# Patient Record
Sex: Female | Born: 1955 | Race: White | Hispanic: No | Smoking: Never smoker
Health system: Southern US, Community
[De-identification: ages and names within clinical notes are randomized; demographics above are authoritative.]

## PROBLEM LIST (undated history)

## (undated) HISTORY — PX: ABDOMINAL HYSTERECTOMY: SHX81

---

## 2000-04-23 ENCOUNTER — Other Ambulatory Visit: Admission: RE | Admit: 2000-04-23 | Discharge: 2000-04-23 | Payer: Self-pay | Admitting: Gynecology

## 2000-04-24 ENCOUNTER — Encounter: Admission: RE | Admit: 2000-04-24 | Discharge: 2000-04-24 | Payer: Self-pay | Admitting: Gynecology

## 2000-04-24 ENCOUNTER — Encounter: Payer: Self-pay | Admitting: Gynecology

## 2001-05-10 ENCOUNTER — Encounter: Admission: RE | Admit: 2001-05-10 | Discharge: 2001-05-10 | Payer: Self-pay | Admitting: Gynecology

## 2001-05-10 ENCOUNTER — Encounter: Payer: Self-pay | Admitting: Gynecology

## 2003-08-31 ENCOUNTER — Ambulatory Visit (HOSPITAL_COMMUNITY): Admission: RE | Admit: 2003-08-31 | Discharge: 2003-08-31 | Payer: Self-pay | Admitting: Family Medicine

## 2003-09-11 ENCOUNTER — Encounter: Admission: RE | Admit: 2003-09-11 | Discharge: 2003-09-11 | Payer: Self-pay | Admitting: Family Medicine

## 2003-10-05 ENCOUNTER — Other Ambulatory Visit: Admission: RE | Admit: 2003-10-05 | Discharge: 2003-10-05 | Payer: Self-pay | Admitting: Family Medicine

## 2005-03-13 ENCOUNTER — Encounter: Admission: RE | Admit: 2005-03-13 | Discharge: 2005-03-13 | Payer: Self-pay | Admitting: Family Medicine

## 2005-06-26 ENCOUNTER — Other Ambulatory Visit: Admission: RE | Admit: 2005-06-26 | Discharge: 2005-06-26 | Payer: Self-pay | Admitting: Family Medicine

## 2006-03-17 ENCOUNTER — Encounter: Admission: RE | Admit: 2006-03-17 | Discharge: 2006-03-17 | Payer: Self-pay | Admitting: Family Medicine

## 2007-03-30 ENCOUNTER — Encounter: Admission: RE | Admit: 2007-03-30 | Discharge: 2007-03-30 | Payer: Self-pay | Admitting: Family Medicine

## 2007-04-01 ENCOUNTER — Other Ambulatory Visit: Admission: RE | Admit: 2007-04-01 | Discharge: 2007-04-01 | Payer: Self-pay | Admitting: Family Medicine

## 2008-04-24 ENCOUNTER — Encounter: Admission: RE | Admit: 2008-04-24 | Discharge: 2008-04-24 | Payer: Self-pay | Admitting: Family Medicine

## 2009-04-25 ENCOUNTER — Encounter: Admission: RE | Admit: 2009-04-25 | Discharge: 2009-04-25 | Payer: Self-pay | Admitting: Family Medicine

## 2010-04-22 ENCOUNTER — Other Ambulatory Visit (HOSPITAL_COMMUNITY): Payer: Self-pay | Admitting: Family Medicine

## 2010-04-22 DIAGNOSIS — Z1231 Encounter for screening mammogram for malignant neoplasm of breast: Secondary | ICD-10-CM

## 2010-04-30 ENCOUNTER — Other Ambulatory Visit (HOSPITAL_COMMUNITY): Payer: Self-pay | Admitting: Family Medicine

## 2010-04-30 ENCOUNTER — Ambulatory Visit
Admission: RE | Admit: 2010-04-30 | Discharge: 2010-04-30 | Disposition: A | Discharge status: Home / Self Care | Payer: BC Managed Care – PPO | Source: Ambulatory Visit | Attending: Family Medicine | Admitting: Family Medicine

## 2010-04-30 ENCOUNTER — Other Ambulatory Visit: Payer: Self-pay | Admitting: Family Medicine

## 2010-04-30 ENCOUNTER — Ambulatory Visit (HOSPITAL_COMMUNITY): Admission: RE | Admit: 2010-04-30 | Payer: BC Managed Care – PPO | Source: Ambulatory Visit

## 2010-04-30 DIAGNOSIS — Z1231 Encounter for screening mammogram for malignant neoplasm of breast: Secondary | ICD-10-CM

## 2011-04-21 ENCOUNTER — Other Ambulatory Visit: Payer: Self-pay | Admitting: Family Medicine

## 2011-04-21 DIAGNOSIS — Z1231 Encounter for screening mammogram for malignant neoplasm of breast: Secondary | ICD-10-CM

## 2011-05-01 ENCOUNTER — Ambulatory Visit
Admission: RE | Admit: 2011-05-01 | Discharge: 2011-05-01 | Disposition: A | Discharge status: Home / Self Care | Payer: BC Managed Care – PPO | Source: Ambulatory Visit | Attending: Family Medicine | Admitting: Family Medicine

## 2011-05-01 DIAGNOSIS — Z1231 Encounter for screening mammogram for malignant neoplasm of breast: Secondary | ICD-10-CM

## 2011-05-05 ENCOUNTER — Other Ambulatory Visit: Payer: Self-pay | Admitting: Family Medicine

## 2011-05-05 DIAGNOSIS — R928 Other abnormal and inconclusive findings on diagnostic imaging of breast: Secondary | ICD-10-CM

## 2011-05-12 ENCOUNTER — Ambulatory Visit
Admission: RE | Admit: 2011-05-12 | Discharge: 2011-05-12 | Disposition: A | Discharge status: Home / Self Care | Payer: BC Managed Care – PPO | Source: Ambulatory Visit | Attending: Family Medicine | Admitting: Family Medicine

## 2011-05-12 DIAGNOSIS — R928 Other abnormal and inconclusive findings on diagnostic imaging of breast: Secondary | ICD-10-CM

## 2012-04-19 ENCOUNTER — Other Ambulatory Visit: Payer: Self-pay

## 2012-04-19 DIAGNOSIS — Z1231 Encounter for screening mammogram for malignant neoplasm of breast: Secondary | ICD-10-CM

## 2012-05-14 ENCOUNTER — Ambulatory Visit
Admission: RE | Admit: 2012-05-14 | Discharge: 2012-05-14 | Disposition: A | Discharge status: Home / Self Care | Payer: BC Managed Care – PPO | Source: Ambulatory Visit

## 2012-05-14 DIAGNOSIS — Z1231 Encounter for screening mammogram for malignant neoplasm of breast: Secondary | ICD-10-CM

## 2013-06-21 ENCOUNTER — Other Ambulatory Visit: Payer: Self-pay

## 2013-06-21 DIAGNOSIS — Z1231 Encounter for screening mammogram for malignant neoplasm of breast: Secondary | ICD-10-CM

## 2013-07-07 ENCOUNTER — Ambulatory Visit
Admission: RE | Admit: 2013-07-07 | Discharge: 2013-07-07 | Disposition: A | Discharge status: Home / Self Care | Payer: BC Managed Care – PPO | Source: Ambulatory Visit

## 2013-07-07 ENCOUNTER — Encounter (INDEPENDENT_AMBULATORY_CARE_PROVIDER_SITE_OTHER): Payer: Self-pay

## 2013-07-07 DIAGNOSIS — Z1231 Encounter for screening mammogram for malignant neoplasm of breast: Secondary | ICD-10-CM

## 2014-07-20 ENCOUNTER — Other Ambulatory Visit: Payer: Self-pay

## 2014-07-20 DIAGNOSIS — Z1231 Encounter for screening mammogram for malignant neoplasm of breast: Secondary | ICD-10-CM

## 2014-07-25 ENCOUNTER — Ambulatory Visit
Admission: RE | Admit: 2014-07-25 | Discharge: 2014-07-25 | Disposition: A | Discharge status: Home / Self Care | Payer: BC Managed Care – PPO | Source: Ambulatory Visit

## 2014-07-25 DIAGNOSIS — Z1231 Encounter for screening mammogram for malignant neoplasm of breast: Secondary | ICD-10-CM

## 2015-01-01 ENCOUNTER — Ambulatory Visit: Payer: BC Managed Care – PPO | Admitting: Sports Medicine

## 2015-08-07 ENCOUNTER — Other Ambulatory Visit: Payer: Self-pay | Admitting: Family Medicine

## 2015-08-07 DIAGNOSIS — Z1231 Encounter for screening mammogram for malignant neoplasm of breast: Secondary | ICD-10-CM

## 2015-08-16 ENCOUNTER — Ambulatory Visit
Admission: RE | Admit: 2015-08-16 | Discharge: 2015-08-16 | Disposition: A | Discharge status: Home / Self Care | Payer: BC Managed Care – PPO | Source: Ambulatory Visit | Attending: Family Medicine | Admitting: Family Medicine

## 2015-08-16 ENCOUNTER — Ambulatory Visit: Payer: BC Managed Care – PPO

## 2015-08-16 DIAGNOSIS — Z1231 Encounter for screening mammogram for malignant neoplasm of breast: Secondary | ICD-10-CM

## 2016-01-04 ENCOUNTER — Ambulatory Visit
Admission: RE | Admit: 2016-01-04 | Discharge: 2016-01-04 | Disposition: A | Discharge status: Home / Self Care | Payer: BC Managed Care – PPO | Source: Ambulatory Visit | Attending: Orthopedic Surgery | Admitting: Orthopedic Surgery

## 2016-01-04 ENCOUNTER — Other Ambulatory Visit: Payer: Self-pay | Admitting: Orthopedic Surgery

## 2016-01-04 DIAGNOSIS — S82142A Displaced bicondylar fracture of left tibia, initial encounter for closed fracture: Secondary | ICD-10-CM

## 2016-01-15 ENCOUNTER — Encounter (HOSPITAL_COMMUNITY)
Admission: RE | Admit: 2016-01-15 | Discharge: 2016-01-15 | Disposition: A | Discharge status: Home / Self Care | Payer: BC Managed Care – PPO | Source: Ambulatory Visit | Attending: Orthopedic Surgery | Admitting: Orthopedic Surgery

## 2016-01-15 ENCOUNTER — Encounter (HOSPITAL_COMMUNITY): Payer: Self-pay

## 2016-01-15 DIAGNOSIS — S82142A Displaced bicondylar fracture of left tibia, initial encounter for closed fracture: Secondary | ICD-10-CM

## 2016-01-15 DIAGNOSIS — S83282A Other tear of lateral meniscus, current injury, left knee, initial encounter: Secondary | ICD-10-CM | POA: Diagnosis not present

## 2016-01-15 DIAGNOSIS — Z01812 Encounter for preprocedural laboratory examination: Secondary | ICD-10-CM

## 2016-01-15 DIAGNOSIS — S83422A Sprain of lateral collateral ligament of left knee, initial encounter: Secondary | ICD-10-CM | POA: Diagnosis not present

## 2016-01-15 DIAGNOSIS — X58XXXA Exposure to other specified factors, initial encounter: Secondary | ICD-10-CM

## 2016-01-15 LAB — CBC
HEMATOCRIT: 38.5 % (ref 36.0–46.0)
Hemoglobin: 12.9 g/dL (ref 12.0–15.0)
MCH: 30 pg (ref 26.0–34.0)
MCHC: 33.5 g/dL (ref 30.0–36.0)
MCV: 89.5 fL (ref 78.0–100.0)
Platelets: 439 10*3/uL — ABNORMAL HIGH (ref 150–400)
RBC: 4.3 MIL/uL (ref 3.87–5.11)
RDW: 13.8 % (ref 11.5–15.5)
WBC: 7.2 10*3/uL (ref 4.0–10.5)

## 2016-01-15 MED ORDER — CHLORHEXIDINE GLUCONATE 4 % EX LIQD
60.0000 mL | Freq: Once | CUTANEOUS | Status: DC
Start: 2016-01-15 — End: 2016-01-16

## 2016-01-15 NOTE — Pre-Procedure Instructions (Signed)
    Chapman FitchJill H Davidson  01/15/2016      [id number] - [place], [place] - [address] [place] [address] Phone: [phone] Fax: [phone]    Your procedure is scheduled on November 30.  [facility] Ad[facility] at 5:30 A.M.  Call this number if you have problems the morning of surgery:  [phone]   Remember:  Do not eat food or drink liquids after midnight.  Take these medicines the morning of surgery with A SIP OF WATER : pain pill if needed.   STOP herbal medications, advil, aleve, ibuprofen, fish oil.     Do not wear jewelry, make-up or nail polish.  Do not wear lotions, powders, or perfumes, or deoderant.  Do not shave 48 hours prior to surgery.  Men may shave face and neck.  Do not bring valuables to the hospital.  [facility] is not responsible for any belongings or valuables.  Contacts, dentures or bridgework may not be worn into surgery.  Leave your suitcase in the car.  After surgery it may be brought to your room.  For patients admitted to the hospital, discharge time will be determined by your treatment team.  Patients discharged the day of surgery will not be allowed to drive home.   Name and phone number of your driver:    Special instructions:  Preparing for surgery  Please read over the following fact sheets that you were given. Pain Booklet and Surgical Site Infection Prevention

## 2016-01-17 ENCOUNTER — Ambulatory Visit (HOSPITAL_COMMUNITY): Payer: BC Managed Care – PPO

## 2016-01-17 ENCOUNTER — Encounter (HOSPITAL_COMMUNITY)
Admission: RE | Disposition: A | Discharge status: Home / Self Care | Payer: Self-pay | Source: Ambulatory Visit | Attending: Orthopedic Surgery

## 2016-01-17 ENCOUNTER — Ambulatory Visit (HOSPITAL_COMMUNITY): Payer: BC Managed Care – PPO | Admitting: Certified Registered"

## 2016-01-17 ENCOUNTER — Observation Stay (HOSPITAL_COMMUNITY)
Admission: RE | Admit: 2016-01-17 | Discharge: 2016-01-19 | Disposition: A | Discharge status: Home / Self Care | Payer: BC Managed Care – PPO | Source: Ambulatory Visit | Attending: Orthopedic Surgery | Admitting: Orthopedic Surgery

## 2016-01-17 ENCOUNTER — Encounter (HOSPITAL_COMMUNITY): Payer: Self-pay | Admitting: *Deleted

## 2016-01-17 ENCOUNTER — Observation Stay (HOSPITAL_COMMUNITY): Payer: BC Managed Care – PPO

## 2016-01-17 DIAGNOSIS — S83422A Sprain of lateral collateral ligament of left knee, initial encounter: Secondary | ICD-10-CM | POA: Insufficient documentation

## 2016-01-17 DIAGNOSIS — S83282A Other tear of lateral meniscus, current injury, left knee, initial encounter: Secondary | ICD-10-CM | POA: Insufficient documentation

## 2016-01-17 DIAGNOSIS — S82142A Displaced bicondylar fracture of left tibia, initial encounter for closed fracture: Principal | ICD-10-CM | POA: Insufficient documentation

## 2016-01-17 DIAGNOSIS — Z419 Encounter for procedure for purposes other than remedying health state, unspecified: Secondary | ICD-10-CM

## 2016-01-17 DIAGNOSIS — X58XXXA Exposure to other specified factors, initial encounter: Secondary | ICD-10-CM | POA: Insufficient documentation

## 2016-01-17 HISTORY — PX: ORIF TIBIA PLATEAU: SHX2132

## 2016-01-17 LAB — PROTIME-INR
INR: 1
Prothrombin Time: 13.2 seconds (ref 11.4–15.2)

## 2016-01-17 LAB — CREATININE, SERUM: Creatinine, Ser: 0.79 mg/dL (ref 0.44–1.00)

## 2016-01-17 LAB — CBC
HEMATOCRIT: 34.5 % — AB (ref 36.0–46.0)
Hemoglobin: 11.4 g/dL — ABNORMAL LOW (ref 12.0–15.0)
MCH: 29.3 pg (ref 26.0–34.0)
MCHC: 33 g/dL (ref 30.0–36.0)
MCV: 88.7 fL (ref 78.0–100.0)
Platelets: 428 10*3/uL — ABNORMAL HIGH (ref 150–400)
RBC: 3.89 MIL/uL (ref 3.87–5.11)
RDW: 13.7 % (ref 11.5–15.5)
WBC: 10.5 10*3/uL (ref 4.0–10.5)

## 2016-01-17 SURGERY — OPEN REDUCTION INTERNAL FIXATION (ORIF) TIBIAL PLATEAU
Anesthesia: General | Site: Leg Lower | Laterality: Left

## 2016-01-17 MED ORDER — FENTANYL CITRATE (PF) 100 MCG/2ML IJ SOLN
INTRAMUSCULAR | Status: AC
  Filled 2016-01-17: qty 2

## 2016-01-17 MED ORDER — 0.9 % SODIUM CHLORIDE (POUR BTL) OPTIME
TOPICAL | Status: DC | PRN
  Administered 2016-01-17: 1000 mL

## 2016-01-17 MED ORDER — MIDAZOLAM HCL 2 MG/2ML IJ SOLN
INTRAMUSCULAR | Status: DC | PRN
  Administered 2016-01-17 (×2): 1 mg via INTRAVENOUS

## 2016-01-17 MED ORDER — OXYCODONE HCL 5 MG PO TABS
5.0000 mg | ORAL_TABLET | ORAL | Status: DC | PRN
  Administered 2016-01-17 – 2016-01-19 (×12): 10 mg via ORAL
  Filled 2016-01-17 (×12): qty 2

## 2016-01-17 MED ORDER — ACETAMINOPHEN 650 MG RE SUPP: 650.0000 mg | Freq: Four times a day (QID) | RECTAL | Status: DC | PRN

## 2016-01-17 MED ORDER — PROMETHAZINE HCL 25 MG/ML IJ SOLN: 6.2500 mg | INTRAMUSCULAR | Status: DC | PRN

## 2016-01-17 MED ORDER — METHOCARBAMOL 500 MG PO TABS
ORAL_TABLET | ORAL | Status: AC
  Filled 2016-01-17: qty 1

## 2016-01-17 MED ORDER — PROPOFOL 10 MG/ML IV BOLUS
INTRAVENOUS | Status: AC
  Filled 2016-01-17: qty 20

## 2016-01-17 MED ORDER — HYDROMORPHONE HCL 1 MG/ML IJ SOLN: 0.2500 mg | INTRAMUSCULAR | Status: DC | PRN

## 2016-01-17 MED ORDER — NEOSTIGMINE METHYLSULFATE 10 MG/10ML IV SOLN
INTRAVENOUS | Status: DC | PRN
  Administered 2016-01-17: 2.5 mg via INTRAVENOUS

## 2016-01-17 MED ORDER — MORPHINE SULFATE (PF) 2 MG/ML IV SOLN
2.0000 mg | INTRAVENOUS | Status: DC | PRN
  Administered 2016-01-17 (×2): 2 mg via INTRAVENOUS
  Filled 2016-01-17 (×2): qty 1

## 2016-01-17 MED ORDER — ACETAMINOPHEN 500 MG PO TABS
1000.0000 mg | ORAL_TABLET | Freq: Four times a day (QID) | ORAL | Status: DC
  Administered 2016-01-17 – 2016-01-19 (×7): 1000 mg via ORAL
  Filled 2016-01-17 (×6): qty 2

## 2016-01-17 MED ORDER — BUPIVACAINE HCL (PF) 0.25 % IJ SOLN
INTRAMUSCULAR | Status: AC
  Filled 2016-01-17: qty 30

## 2016-01-17 MED ORDER — CALCIUM CARBONATE 1500 (600 CA) MG PO TABS
600.0000 mg | ORAL_TABLET | Freq: Two times a day (BID) | ORAL | Status: DC
  Filled 2016-01-17 (×2): qty 1

## 2016-01-17 MED ORDER — OXYCODONE HCL 5 MG PO TABS
ORAL_TABLET | ORAL | Status: AC
  Filled 2016-01-17: qty 2

## 2016-01-17 MED ORDER — DOCUSATE SODIUM 100 MG PO CAPS
100.0000 mg | ORAL_CAPSULE | Freq: Two times a day (BID) | ORAL | Status: DC
  Administered 2016-01-17 – 2016-01-19 (×4): 100 mg via ORAL
  Filled 2016-01-17 (×4): qty 1

## 2016-01-17 MED ORDER — MIDAZOLAM HCL 2 MG/2ML IJ SOLN
INTRAMUSCULAR | Status: AC
  Administered 2016-01-17: 2 mg via INTRAVENOUS
  Filled 2016-01-17: qty 2

## 2016-01-17 MED ORDER — PROMETHAZINE HCL 25 MG/ML IJ SOLN
12.5000 mg | Freq: Four times a day (QID) | INTRAMUSCULAR | Status: DC | PRN
  Administered 2016-01-17: 12.5 mg via INTRAVENOUS
  Filled 2016-01-17: qty 1

## 2016-01-17 MED ORDER — CEFAZOLIN SODIUM-DEXTROSE 2-4 GM/100ML-% IV SOLN
2.0000 g | INTRAVENOUS | Status: AC
  Administered 2016-01-17: 2 g via INTRAVENOUS

## 2016-01-17 MED ORDER — CEFAZOLIN SODIUM-DEXTROSE 2-4 GM/100ML-% IV SOLN
INTRAVENOUS | Status: AC
  Filled 2016-01-17: qty 100

## 2016-01-17 MED ORDER — MIDAZOLAM HCL 2 MG/2ML IJ SOLN
2.0000 mg | Freq: Once | INTRAMUSCULAR | Status: AC
  Administered 2016-01-17: 2 mg via INTRAVENOUS

## 2016-01-17 MED ORDER — CALCIUM CARBONATE 1250 (500 CA) MG PO TABS
1500.0000 mg | ORAL_TABLET | Freq: Two times a day (BID) | ORAL | Status: DC
  Administered 2016-01-18 – 2016-01-19 (×3): 1500 mg via ORAL
  Filled 2016-01-17 (×3): qty 2

## 2016-01-17 MED ORDER — MORPHINE SULFATE (PF) 4 MG/ML IV SOLN
4.0000 mg | INTRAVENOUS | Status: AC | PRN
  Administered 2016-01-17 (×3): 4 mg via INTRAVENOUS

## 2016-01-17 MED ORDER — ONDANSETRON HCL 4 MG/2ML IJ SOLN
INTRAMUSCULAR | Status: DC | PRN
  Administered 2016-01-17: 4 mg via INTRAVENOUS

## 2016-01-17 MED ORDER — MORPHINE SULFATE (PF) 4 MG/ML IV SOLN
INTRAVENOUS | Status: AC
Start: 2016-01-17 — End: 2016-01-17
  Administered 2016-01-17: 4 mg via INTRAVENOUS
  Filled 2016-01-17: qty 1

## 2016-01-17 MED ORDER — DEXAMETHASONE SODIUM PHOSPHATE 10 MG/ML IJ SOLN
INTRAMUSCULAR | Status: DC | PRN
  Administered 2016-01-17: 10 mg via INTRAVENOUS

## 2016-01-17 MED ORDER — LACTATED RINGERS IV SOLN
INTRAVENOUS | Status: DC | PRN
  Administered 2016-01-17 (×2): via INTRAVENOUS

## 2016-01-17 MED ORDER — ENOXAPARIN SODIUM 40 MG/0.4ML ~~LOC~~ SOLN
40.0000 mg | SUBCUTANEOUS | Status: DC
  Administered 2016-01-17 – 2016-01-18 (×2): 40 mg via SUBCUTANEOUS
  Filled 2016-01-17 (×2): qty 0.4

## 2016-01-17 MED ORDER — GLYCOPYRROLATE 0.2 MG/ML IV SOSY
PREFILLED_SYRINGE | INTRAVENOUS | Status: AC
  Filled 2016-01-17: qty 6

## 2016-01-17 MED ORDER — ONDANSETRON HCL 4 MG/2ML IJ SOLN
4.0000 mg | Freq: Four times a day (QID) | INTRAMUSCULAR | Status: DC | PRN
  Filled 2016-01-17: qty 2

## 2016-01-17 MED ORDER — ONDANSETRON HCL 4 MG/2ML IJ SOLN
INTRAMUSCULAR | Status: AC
  Filled 2016-01-17: qty 2

## 2016-01-17 MED ORDER — MORPHINE SULFATE (PF) 4 MG/ML IV SOLN
INTRAVENOUS | Status: AC
  Administered 2016-01-17: 4 mg via INTRAVENOUS
  Filled 2016-01-17: qty 1

## 2016-01-17 MED ORDER — LIDOCAINE HCL (CARDIAC) 20 MG/ML IV SOLN
INTRAVENOUS | Status: DC | PRN
  Administered 2016-01-17: 50 mg via INTRATRACHEAL

## 2016-01-17 MED ORDER — DEXAMETHASONE SODIUM PHOSPHATE 10 MG/ML IJ SOLN
INTRAMUSCULAR | Status: AC
  Filled 2016-01-17: qty 1

## 2016-01-17 MED ORDER — ROCURONIUM BROMIDE 100 MG/10ML IV SOLN
INTRAVENOUS | Status: DC | PRN
  Administered 2016-01-17: 40 mg via INTRAVENOUS
  Administered 2016-01-17: 20 mg via INTRAVENOUS

## 2016-01-17 MED ORDER — MORPHINE SULFATE (PF) 4 MG/ML IV SOLN
INTRAVENOUS | Status: AC
  Filled 2016-01-17: qty 1

## 2016-01-17 MED ORDER — METHOCARBAMOL 500 MG PO TABS
500.0000 mg | ORAL_TABLET | Freq: Four times a day (QID) | ORAL | Status: DC | PRN
  Administered 2016-01-17 – 2016-01-19 (×6): 500 mg via ORAL
  Filled 2016-01-17 (×7): qty 1

## 2016-01-17 MED ORDER — GLYCOPYRROLATE 0.2 MG/ML IJ SOLN
INTRAMUSCULAR | Status: DC | PRN
  Administered 2016-01-17: 0.4 mg via INTRAVENOUS

## 2016-01-17 MED ORDER — PROPOFOL 10 MG/ML IV BOLUS
INTRAVENOUS | Status: DC | PRN
  Administered 2016-01-17: 150 mg via INTRAVENOUS
  Administered 2016-01-17 (×6): 10 mg via INTRAVENOUS

## 2016-01-17 MED ORDER — LIDOCAINE 2% (20 MG/ML) 5 ML SYRINGE
INTRAMUSCULAR | Status: AC
  Filled 2016-01-17: qty 5

## 2016-01-17 MED ORDER — METHOCARBAMOL 1000 MG/10ML IJ SOLN
500.0000 mg | Freq: Four times a day (QID) | INTRAVENOUS | Status: DC | PRN
  Filled 2016-01-17: qty 5

## 2016-01-17 MED ORDER — HYDROMORPHONE HCL 1 MG/ML IJ SOLN
0.2500 mg | INTRAMUSCULAR | Status: DC | PRN
  Administered 2016-01-17 (×4): 0.5 mg via INTRAVENOUS

## 2016-01-17 MED ORDER — ACETAMINOPHEN 325 MG PO TABS: 650.0000 mg | ORAL_TABLET | Freq: Four times a day (QID) | ORAL | Status: DC | PRN

## 2016-01-17 MED ORDER — MIDAZOLAM HCL 2 MG/2ML IJ SOLN
INTRAMUSCULAR | Status: AC
  Filled 2016-01-17: qty 2

## 2016-01-17 MED ORDER — HYDROMORPHONE HCL 2 MG/ML IJ SOLN
INTRAMUSCULAR | Status: AC
  Filled 2016-01-17: qty 1

## 2016-01-17 MED ORDER — VITAMIN D3 25 MCG (1000 UNIT) PO TABS
1000.0000 [IU] | ORAL_TABLET | Freq: Every day | ORAL | Status: DC
  Administered 2016-01-17: 1000 [IU] via ORAL
  Filled 2016-01-17 (×3): qty 1

## 2016-01-17 MED ORDER — FENTANYL CITRATE (PF) 100 MCG/2ML IJ SOLN
INTRAMUSCULAR | Status: DC | PRN
  Administered 2016-01-17: 25 ug via INTRAVENOUS
  Administered 2016-01-17: 50 ug via INTRAVENOUS
  Administered 2016-01-17: 25 ug via INTRAVENOUS
  Administered 2016-01-17: 50 ug via INTRAVENOUS
  Administered 2016-01-17: 100 ug via INTRAVENOUS
  Administered 2016-01-17 (×2): 25 ug via INTRAVENOUS
  Administered 2016-01-17 (×2): 50 ug via INTRAVENOUS

## 2016-01-17 SURGICAL SUPPLY — 65 items
BANDAGE ACE 6X5 VEL STRL LF (GAUZE/BANDAGES/DRESSINGS) ×1 IMPLANT
BANDAGE ELASTIC 4 VELCRO ST LF (GAUZE/BANDAGES/DRESSINGS) ×1 IMPLANT
BANDAGE ELASTIC 6 VELCRO ST LF (GAUZE/BANDAGES/DRESSINGS) ×2 IMPLANT
BANDAGE ESMARK 6X9 LF (GAUZE/BANDAGES/DRESSINGS) ×1 IMPLANT
BIT DRILL CALIBR QC 2.5X250 (BIT) ×1 IMPLANT
BIT DRILL CALIBR QC 2.8X250 (BIT) ×2 IMPLANT
BLADE SURG ROTATE 9660 (MISCELLANEOUS) IMPLANT
BNDG CMPR 9X6 STRL LF SNTH (GAUZE/BANDAGES/DRESSINGS) ×1
BNDG COHESIVE 6X5 TAN STRL LF (GAUZE/BANDAGES/DRESSINGS) ×2 IMPLANT
BNDG ESMARK 6X9 LF (GAUZE/BANDAGES/DRESSINGS) ×2
COMPONENT TIB PRX PSTMD 3.5 2H (Orthopedic Implant) IMPLANT
COVER SURGICAL LIGHT HANDLE (MISCELLANEOUS) ×3 IMPLANT
DRAPE C-ARM 42X72 X-RAY (DRAPES) ×2 IMPLANT
DRAPE C-ARMOR (DRAPES) ×2 IMPLANT
DRAPE IMP U-DRAPE 54X76 (DRAPES) ×4 IMPLANT
DRAPE INCISE IOBAN 66X45 STRL (DRAPES) ×1 IMPLANT
DRAPE ORTHO SPLIT 77X108 STRL (DRAPES) ×6
DRAPE PROXIMA HALF (DRAPES) ×4 IMPLANT
DRAPE SURG ORHT 6 SPLT 77X108 (DRAPES) ×3 IMPLANT
DRAPE U-SHAPE 47X51 STRL (DRAPES) ×2 IMPLANT
DRSG PAD ABDOMINAL 8X10 ST (GAUZE/BANDAGES/DRESSINGS) ×1 IMPLANT
DURAPREP 26ML APPLICATOR (WOUND CARE) ×2 IMPLANT
ELECT REM PT RETURN 9FT ADLT (ELECTROSURGICAL) ×2
ELECTRODE REM PT RTRN 9FT ADLT (ELECTROSURGICAL) ×1 IMPLANT
FACESHIELD STD STERILE (MASK) ×1 IMPLANT
GAUZE SPONGE 4X4 12PLY STRL (GAUZE/BANDAGES/DRESSINGS) ×1 IMPLANT
GAUZE XEROFORM 1X8 LF (GAUZE/BANDAGES/DRESSINGS) ×1 IMPLANT
GAUZE XEROFORM 5X9 LF (GAUZE/BANDAGES/DRESSINGS) ×1 IMPLANT
GLOVE BIO SURGEON STRL SZ7.5 (GLOVE) ×2 IMPLANT
GLOVE BIOGEL PI IND STRL 8 (GLOVE) ×1 IMPLANT
GLOVE BIOGEL PI INDICATOR 8 (GLOVE) ×1
GOWN STRL REUS W/ TWL LRG LVL3 (GOWN DISPOSABLE) ×2 IMPLANT
GOWN STRL REUS W/ TWL XL LVL3 (GOWN DISPOSABLE) ×1 IMPLANT
GOWN STRL REUS W/TWL LRG LVL3 (GOWN DISPOSABLE) ×4
GOWN STRL REUS W/TWL XL LVL3 (GOWN DISPOSABLE) ×2
KIT BASIN OR (CUSTOM PROCEDURE TRAY) ×2 IMPLANT
KIT ROOM TURNOVER OR (KITS) ×2 IMPLANT
MANIFOLD NEPTUNE II (INSTRUMENTS) ×2 IMPLANT
NDL SUT 6 .5 CRC .975X.05 MAYO (NEEDLE) IMPLANT
NEEDLE MAYO TAPER (NEEDLE)
NS IRRIG 1000ML POUR BTL (IV SOLUTION) ×2 IMPLANT
PACK GENERAL/GYN (CUSTOM PROCEDURE TRAY) ×2 IMPLANT
PAD ABD 8X10 STRL (GAUZE/BANDAGES/DRESSINGS) ×1 IMPLANT
PAD ARMBOARD 7.5X6 YLW CONV (MISCELLANEOUS) ×4 IMPLANT
PAD CAST 4YDX4 CTTN HI CHSV (CAST SUPPLIES) IMPLANT
PADDING CAST COTTON 4X4 STRL (CAST SUPPLIES) ×2
PADDING CAST COTTON 6X4 STRL (CAST SUPPLIES) ×1 IMPLANT
PLATE PROX TIB LT 3.5 VA-LCP (Plate) ×1 IMPLANT
SCREW 3.5X60 (Screw) ×1 IMPLANT
SCREW LOCKING 3.5X70MM VA (Screw) ×3 IMPLANT
SPONGE GAUZE 4X4 12PLY STER LF (GAUZE/BANDAGES/DRESSINGS) IMPLANT
STAPLER VISISTAT 35W (STAPLE) ×2 IMPLANT
STOCKINETTE IMPERVIOUS LG (DRAPES) ×1 IMPLANT
SUT ETHILON 2 0 FS 18 (SUTURE) IMPLANT
SUT ETHILON 3 0 PS 1 (SUTURE) IMPLANT
SUT MON AB 2-0 CT1 36 (SUTURE) ×2 IMPLANT
SUT PDS AB 0 CT 36 (SUTURE) ×2 IMPLANT
SUT VIC AB 0 CT1 27 (SUTURE) ×4
SUT VIC AB 0 CT1 27XBRD ANBCTR (SUTURE) IMPLANT
SUT VIC AB 2-0 CT1 27 (SUTURE)
SUT VIC AB 2-0 CT1 TAPERPNT 27 (SUTURE) IMPLANT
TIBIA PROX POSTMED LCP 3.5 2H (Orthopedic Implant) ×2 IMPLANT
TOWEL OR 17X24 6PK STRL BLUE (TOWEL DISPOSABLE) ×2 IMPLANT
TOWEL OR 17X26 10 PK STRL BLUE (TOWEL DISPOSABLE) ×4 IMPLANT
YANKAUER SUCT BULB TIP NO VENT (SUCTIONS) ×1 IMPLANT

## 2016-01-17 NOTE — H&P (Signed)
   ORTHOPAEDIC H and P  REQUESTING PHYSICIAN: Yolonda KidaJason Patrick Rogers, MD  PCP:  Allean FoundSMITH,CANDACE THIELE, MD  Chief Complaint: Left tibial plateau fx  HPI: Cheryl Davidson is a 60 y.o. female who complains of  Left knee fractures.  She is here today for surgery.  No new complaints from my exam in clinic last week.  All questions answered.  She is ready for surgery.  History reviewed. No pertinent past medical history. Past Surgical History:  Procedure Laterality Date  . ABDOMINAL HYSTERECTOMY     30 years ago  . CESAREAN SECTION     Social History   Social History  . Marital status: Legally Separated    Spouse name: N/A  . Number of children: N/A  . Years of education: N/A   Social History Main Topics  . Smoking status: Never Smoker  . Smokeless tobacco: Never Used  . Alcohol use None  . Drug use:     Types: Oxycodone  . Sexual activity: Not Asked   Other Topics Concern  . None   Social History Narrative  . None   History reviewed. No pertinent family history. No Known Allergies Prior to Admission medications   Medication Sig Start Date End Date Taking? Authorizing Provider  calcium carbonate (OSCAL) 1500 (600 Ca) MG TABS tablet Take 600 mg of elemental calcium by mouth 2 (two) times daily with a meal.   Yes Historical Provider, MD  Cholecalciferol (VITAMIN D3) 1000 units CAPS Take 1,000 Units by mouth daily.   Yes Historical Provider, MD  enoxaparin (LOVENOX) 40 MG/0.4ML injection INJECT 0.4 ML (1 SYRINGE) DAILY AS DIRECTED 01/07/16  Yes Historical Provider, MD  oxyCODONE-acetaminophen (PERCOCET/ROXICET) 5-325 MG tablet Take 1 tablet by mouth every 4 (four) hours as needed for moderate pain. for pain 01/04/16  Yes Historical Provider, MD   No results found.  Positive ROS: All other systems have been reviewed and were otherwise negative with the exception of those mentioned in the HPI and as above.  Physical Exam: General: Alert, no acute distress Cardiovascular: No  pedal edema Respiratory: No cyanosis, no use of accessory musculature GI: No organomegaly, abdomen is soft and non-tender Skin: No lesions in the area of chief complaint Neurologic: Sensation intact distally Psychiatric: Patient is competent for consent with normal mood and affect Lymphatic: No axillary or cervical lymphadenopathy  LLE- +NVI, compartments soft  Assessment: Left bicondylar tibial plateau fracture  Plan: -ORIF today -admit post op for pain control -The risks, benefits, and alternatives were discussed with the patient. There are risks associated with the surgery including, but not limited to, problems with anesthesia (death), infection, differences in leg length/angulation/rotation, fracture of bones, loosening or failure of implants, malunion, nonunion, hematoma (blood accumulation) which may require surgical drainage, blood clots, pulmonary embolism, nerve injury (foot drop), and blood vessel injury. The patient understands these risks and elects to proceed.     Yolonda KidaJason Patrick Rogers, MD Cell [phone]    01/17/2016 7:09 AM

## 2016-01-17 NOTE — Anesthesia Procedure Notes (Signed)
Procedure Name: Intubation Date/Time: 01/17/2016 7:37 AM Performed by: Wilder GladeWINN, Sigrid Schwebach G Pre-anesthesia Checklist: Patient identified, Emergency Drugs available, Suction available, Patient being monitored and Timeout performed Patient Re-evaluated:Patient Re-evaluated prior to inductionOxygen Delivery Method: Circle system utilized Preoxygenation: Pre-oxygenation with 100% oxygen Intubation Type: IV induction Ventilation: Mask ventilation without difficulty Laryngoscope Size: Miller and 2 Grade View: Grade I Tube type: Oral Tube size: 7.0 mm Number of attempts: 1 Airway Equipment and Method: Stylet Placement Confirmation: ETT inserted through vocal cords under direct vision,  positive ETCO2,  CO2 detector and breath sounds checked- equal and bilateral Secured at: 22 cm Tube secured with: Tape Dental Injury: Teeth and Oropharynx as per pre-operative assessment

## 2016-01-17 NOTE — Brief Op Note (Signed)
01/17/2016  10:51 AM  PATIENT:  Antonieta LovelessJill K Christopherson  60 y.o. female  PRE-OPERATIVE DIAGNOSIS:  left tibia plateau fracture  POST-OPERATIVE DIAGNOSIS:  left tibia plateau fracture  PROCEDURE:  Procedure(s): OPEN REDUCTION INTERNAL FIXATION (ORIF) TIBIAL PLATEAU (Left)  SURGEON:  Surgeon(s) and Role:    * Yolonda KidaJason Patrick Amir Glaus, MD - Primary  PHYSICIAN ASSISTANT:   ASSISTANTS: none   ANESTHESIA:   general  EBL:  Total I/O In: 1600 [I.V.:1600] Out: 100 [Blood:100]  BLOOD ADMINISTERED:none  DRAINS: none   LOCAL MEDICATIONS USED:  NONE  SPECIMEN:  No Specimen  DISPOSITION OF SPECIMEN:  N/A  COUNTS:  YES  TOURNIQUET:  not used  DICTATION: .Note written in EPIC  PLAN OF CARE: Admit for overnight observation  PATIENT DISPOSITION:  PACU - hemodynamically stable.   Delay start of Pharmacological VTE agent (>24hrs) due to surgical blood loss or risk of bleeding: not applicable

## 2016-01-17 NOTE — Transfer of Care (Signed)
Immediate Anesthesia Transfer of Care Note  Patient: Antonieta LovelessJill K Sedivy  Procedure(s) Performed: Procedure(s): OPEN REDUCTION INTERNAL FIXATION (ORIF) TIBIAL PLATEAU (Left)  Patient Location: PACU  Anesthesia Type:General  Level of Consciousness: awake, alert , oriented and patient cooperative  Airway & Oxygen Therapy: Patient Spontanous Breathing and Patient connected to nasal cannula oxygen  Post-op Assessment: Report given to RN and Post -op Vital signs reviewed and stable  Post vital signs: Reviewed and stable  Last Vitals:  Vitals:   01/17/16 0557 01/17/16 1051  BP: (!) 142/77 127/64  Pulse: 73 62  Resp: 20 14  Temp: 36.8 C 36.3 C    Last Pain:  Vitals:   01/17/16 1051  TempSrc:   PainSc: 0-No pain      Patients Stated Pain Goal: 4 (01/17/16 0610)  Complications: No apparent anesthesia complications

## 2016-01-17 NOTE — Op Note (Addendum)
Date of Surgery: 01/17/2016  Antonieta LovelessJill K Bernabe   INDICATIONS: Cheryl Davidson is a 60 y.o.-year-old female who sustained a left tibial plateau fracture; she was indicated for open reduction and internal fixation due to the displaced nature of the articular fracture and came to the operating room today for this procedure. The patient did consent to the procedure after discussion of the risks and benefits.  Specifically we discussed the displaced and depressed nature of this articular fracture and the implications for knee arthrosis in the short term.  PREOPERATIVE DIAGNOSIS: 1. Left tibial plateau fracture (bicondylar). 2. Left lateral meniscus tear 3. Left proximal LCL avulsion  POSTOPERATIVE DIAGNOSIS: Same.  PROCEDURE: 1.  left tibial plateau open reduction and internal fixation.  Bicondylar with dual plates 2. Repair of lateral meniscus, Left.  SURGEON: Maryan RuedJason P Lileigh Fahringer, M.D.  ASSIST: none, .  ANESTHESIA:  general  IV FLUIDS AND URINE: See anesthesia.  ESTIMATED BLOOD LOSS: per anesthesia  IMPLANTS: Synthes lateral and medial proximal tibial locking plate with 3.5 mm locking and nonlocking screws.   DRAINS: none  COMPLICATIONS: None.  DESCRIPTION OF PROCEDURE: The patient was brought to the operating room and placed supine on the operating table.  The patient had been signed prior to the procedure and this was documented. The patient had the anesthesia placed by the anesthesiologist.  The prep verification and incision time-outs were performed to confirm that this was the correct patient, site, side and location. The patient had an SCD on the opposite lower extremity. The patient did receive antibiotics prior to the incision and was re-dosed during the procedure as needed at indicated intervals.  The patient had the lower extremity prepped and draped in the standard surgical fashion.  The bony landmarks were palpated and the incision was drawn with a marker.    We first began with the  posteromedial fracture fragment.  Incision was made from medial epicondyle down along the posterior border of the tibia distally.  Incision was carried down through skin and subcutaneous tissue to the sartorial fascia.  The sartorial fascia was opened and a plane was established between the hamstring tendons and the medial head of the Gastrocnemius.  Dissection was carried down to the level of the posteromedial border of the tibia.  The popliteus muscle was elevated directly off the tibia, posteriorly, exposing the bone and posteromedial fracture.  Next the fracture was booked open and the medial plateau was tamped up to its anatomic height using bone tamp.  This was confirmed on AP and lateral fluoro views.  Next, the lateral plateau articular depression was attempted to be elevated.  There was considerable loss of articular and subchondral bone from the injury on the lateral plateau, but the remainder of that was elevated up to the appropriate height for the lateral plateau.  The far lateral plateau had the best subchondral bone.  Due to large void of bone at the joint level and no way to contain any filler, we elected to not place filler or graft into the lateral condyle, to avoid direct extravasation into the joint.  Next the posteromedial fragment was reduced and provisionally held with K-wires for application of the posteromedial plate.  A 3.565mm posteromedial plate was applied and placement was confirmed on fluoroscopy and was then secured.  Proximally rafting cancellous screws were used to support the articular bone proximally.  Next, the shaft screws were placed in a similar fashion.  By first drilling, measuring and then placing the screws.  The  reduction and placement of plate were again confirmed on AP and lateral xray shots.    We next turned to the lateral plate.  The incision was taken down through the skin and subcutaneous tissue with the knife down to the fascia. The fascia was then incised in line  with the skin incision, taking care to extend through Gerdy's tubercle. The fascia was then taken anteriorly and posteriorly off of Gerdy's tubercle, and this exposed the joint capsule. The anterior compartment was also gently elevated from distal to proximal off of the tibia to expose the lateral tibia, taking care to leave the fascia available for later repair.  Next the submeniscal arthrotomy was then performed, taking care to avoid any damage to the meniscus.  The meniscus itself was free floating away from the lateral capsule in a vertical tear orientation.  This was repaired with 2 separate 0 PDS horizontal mattress sutures with direct repair back to the capsule and through meniscus tissue.  Next we placed a lateral proximal tibia plate. This was done to reduce the widened condyles together, and support any lateral articular surface.  All screws were then placed in the standard fashion, first drilling, then measuring with a depth gauge, and then placing the screws on power and tightening by hand. The most proximal locking screws were then all placed. The remaining distal cortical screws were then placed.  All screws were confirmed on both AP and lateral views including both the length and location. The wounds were copiously irrigated. The deep fascia was closed with 0 Vicryl figure-of-eight interrupted suture. This was closed with no difficulty or tension in the anterior compartment. The subcutaneous layer was closed with 2-0 monocryl. The skin was then reapproximated with staples. The wounds were cleaned and dried a final time. A sterile dressing was placed. The patient was then wrapped in an Ace and placed in knee immobilizer. The patient was then transferred back to the bed and left the operating room in stable condition.  All sponge and instrument counts were correct.  POSTOPERATIVE PLAN:Josseline K Kashuba , will remain nonweightbearing on this leg for approximately 12 weeks; she will return for suture  removal in 2 weeks.  Knee immobilizer until follow up with me.  Cheryl Davidson will receive DVT prophylaxis based on other medications, activity level, and risk ratio of bleeding to thrombosis.  The LCL injury appeared to be the only lateral injury based on preop CT and direct inspection at surgery and will be managed nonop with a knee brace while the plateau heals.

## 2016-01-17 NOTE — Anesthesia Preprocedure Evaluation (Addendum)
Anesthesia Evaluation  Patient identified by MRN, date of birth, ID band Patient awake    Reviewed: Allergy & Precautions, NPO status , Patient's Chart, lab work & pertinent test results  Airway Mallampati: I  TM Distance: >3 FB Neck ROM: Full    Dental  (+) Teeth Intact   Pulmonary neg pulmonary ROS,    breath sounds clear to auscultation       Cardiovascular negative cardio ROS   Rhythm:Regular Rate:Normal     Neuro/Psych negative neurological ROS     GI/Hepatic negative GI ROS,   Endo/Other  negative endocrine ROS  Renal/GU negative Renal ROS     Musculoskeletal negative musculoskeletal ROS (+)   Abdominal   Peds  Hematology negative hematology ROS (+)   Anesthesia Other Findings   Reproductive/Obstetrics                            Anesthesia Physical Anesthesia Plan  ASA: I  Anesthesia Plan: General   Post-op Pain Management:    Induction: Intravenous  Airway Management Planned: Oral ETT  Additional Equipment:   Intra-op Plan:   Post-operative Plan: Extubation in OR  Informed Consent: I have reviewed the patients History and Physical, chart, labs and discussed the procedure including the risks, benefits and alternatives for the proposed anesthesia with the patient or authorized representative who has indicated his/her understanding and acceptance.     Plan Discussed with:   Anesthesia Plan Comments:         Anesthesia Quick Evaluation

## 2016-01-17 NOTE — Anesthesia Postprocedure Evaluation (Signed)
Anesthesia Post Note  Patient: Cheryl Davidson  Procedure(s) Performed: Procedure(s) (LRB): OPEN REDUCTION INTERNAL FIXATION (ORIF) TIBIAL PLATEAU (Left)  Patient location during evaluation: PACU Anesthesia Type: General Level of consciousness: awake, oriented and sedated Pain management: pain level controlled Vital Signs Assessment: post-procedure vital signs reviewed and stable Respiratory status: spontaneous breathing, nonlabored ventilation, respiratory function stable and patient connected to nasal cannula oxygen Cardiovascular status: blood pressure returned to baseline and stable Postop Assessment: no signs of nausea or vomiting Anesthetic complications: no    Last Vitals:  Vitals:   01/17/16 1305 01/17/16 1344  BP: 130/60 130/61  Pulse: 73 74  Resp: 11   Temp:  36.9 C    Last Pain:  Vitals:   01/17/16 1410  TempSrc:   PainSc: 6                  Tajon Moring,JAMES TERRILL

## 2016-01-18 ENCOUNTER — Encounter (HOSPITAL_COMMUNITY): Payer: Self-pay | Admitting: Orthopedic Surgery

## 2016-01-18 DIAGNOSIS — S82142A Displaced bicondylar fracture of left tibia, initial encounter for closed fracture: Secondary | ICD-10-CM | POA: Diagnosis not present

## 2016-01-18 MED ORDER — DOCUSATE SODIUM 100 MG PO CAPS: 100.0000 mg | ORAL_CAPSULE | Freq: Two times a day (BID) | ORAL | 0 refills | Status: AC

## 2016-01-18 MED ORDER — VITAMIN D 1000 UNITS PO TABS
1000.0000 [IU] | ORAL_TABLET | Freq: Every day | ORAL | Status: DC
  Administered 2016-01-18 – 2016-01-19 (×2): 1000 [IU] via ORAL
  Filled 2016-01-18 (×2): qty 1

## 2016-01-18 MED ORDER — PROMETHAZINE HCL 12.5 MG PO TABS: 12.5000 mg | ORAL_TABLET | Freq: Four times a day (QID) | ORAL | 0 refills | Status: AC | PRN

## 2016-01-18 MED ORDER — OXYCODONE HCL 5 MG PO TABS: 5.0000 mg | ORAL_TABLET | ORAL | 0 refills | Status: AC | PRN

## 2016-01-18 MED ORDER — METHOCARBAMOL 500 MG PO TABS: 500.0000 mg | ORAL_TABLET | Freq: Four times a day (QID) | ORAL | 0 refills | Status: AC | PRN

## 2016-01-18 NOTE — Care Management (Signed)
Care manager spoke with Dr. Aundria Rudogers concerning patient's discharge needs. Patient will not need home health,she is to be encouraged to do quad set exercises as instructed. Patient states she has walker, crutches and will be borrowing a 3in1. Patient's daughter will stay with her at discharge.

## 2016-01-18 NOTE — Progress Notes (Signed)
Case manager notified Dr. Aundria Rudogers earlier in the day letting him know pt will be staying until tomorrow for more PT.

## 2016-01-18 NOTE — Progress Notes (Addendum)
   Subjective:  Patient reports pain as mild.  Did have some n/v overnight, but better this am.  Objective:   VITALS:   Vitals:   01/17/16 1344 01/17/16 1936 01/18/16 0045 01/18/16 0611  BP: 130/61 (!) 142/64 139/72 (!) 109/46  Pulse: 74 67 72 70  Resp:  16 16 16   Temp: 98.4 F (36.9 C) 99.5 F (37.5 C) 98.7 F (37.1 C) 98.9 F (37.2 C)  TempSrc: Oral Oral Oral Oral  SpO2: 98% 99% 99% 98%  Weight:        Neurologically intact Neurovascular intact Sensation intact distally Intact pulses distally Dorsiflexion/Plantar flexion intact Incision: dressing C/D/I Compartment soft   Lab Results  Component Value Date   WBC 10.5 01/17/2016   HGB 11.4 (L) 01/17/2016   HCT 34.5 (L) 01/17/2016   MCV 88.7 01/17/2016   PLT 428 (H) 01/17/2016   BMET    Component Value Date/Time   CREATININE 0.79 01/17/2016 1500   GFRNONAA >60 01/17/2016 1500   GFRAA >60 01/17/2016 1500     Assessment/Plan: 1 Day Post-Op   Active Problems:   Bicondylar fracture of left tibia   Advance diet Up with therapy NWB to LLE- with KI on when up out of bed, needs to work on quad sets in bed and patellar mobility Working on controlling nausea and pain today, will dc when able Follow up with Aundria Rudogers in 2 weeks for wound check She will continue her lovenox for dvt ppx, she has enough for post op coverage   Cheryl Davidson 01/18/2016, 7:42 AM   Cheryl RuedJason P Judithe Keetch, MD [phone]

## 2016-01-18 NOTE — Progress Notes (Signed)
   01/18/16 1600  PT Visit Information  Last PT Received On 01/18/16  Assistance Needed +1  History of Present Illness Pt is a 60 year old female s/p L ORIF tibial plateau  Subjective Data  Subjective Pt practiced safe stair technique and ambulated very short distance.  Pt to possibly d/c home later today.  Precautions  Precautions Fall  Required Braces or Orthoses Knee Immobilizer - Left  Knee Immobilizer - Left On when out of bed or walking  Restrictions  Weight Bearing Restrictions Yes  LLE Weight Bearing NWB  Pain Assessment  Pain Assessment 0-10  Pain Score 5  Pain Location L knee  Pain Descriptors / Indicators Aching;Sore  Pain Intervention(s) Limited activity within patient's tolerance;Monitored during session;Premedicated before session  Cognition  Arousal/Alertness Awake/alert  Behavior During Therapy WFL for tasks assessed/performed  Overall Cognitive Status Within Functional Limits for tasks assessed  Bed Mobility  General bed mobility comments pt up in recliner  Transfers  Overall transfer level Needs assistance  Equipment used Rolling walker (2 wheeled)  Transfers Sit to/from Stand  Sit to Stand Min guard  General transfer comment verbal cues for UE and LE positioning, pt aware of NWB status - has been performing this prior to surgery  Ambulation/Gait  Ambulation/Gait assistance Min guard  Ambulation Distance (Feet) 10 Feet  Assistive device Rolling walker (2 wheeled)  General Gait Details pt continues to use RW well, distance limited this afternoon due to pain, performed steps prior (brought to steps in recliner first then ambulated)  Stairs Yes  Stairs assistance Min guard  Stair Management Backwards;Step to pattern;With walker  Number of Stairs 2  General stair comments verbal cues for safety, sequence, technique with RW backwards, pt aware second person needed to hold RW for safety, pt will likely have chair at top of steps and perform one then sit down, pt  also states she may try using crutches again once home however liked learning using RW  PT - End of Session  Equipment Utilized During Treatment Gait belt;Left knee immobilizer  Activity Tolerance Patient tolerated treatment well  Patient left in chair;with call bell/phone within reach  PT - Assessment/Plan  PT Plan Current plan remains appropriate  PT Frequency (ACUTE ONLY) Min 5X/week  Follow Up Recommendations No PT follow up  PT equipment None recommended by PT  PT Goal Progression  Progress towards PT goals Progressing toward goals  PT Time Calculation  PT Start Time (ACUTE ONLY) 1335  PT Stop Time (ACUTE ONLY) 1352  PT Time Calculation (min) (ACUTE ONLY) 17 min  PT General Charges  $$ ACUTE PT VISIT 1 Procedure  PT Treatments  $Gait Training 8-22 mins   Zenovia JarredKati Jenell Dobransky, PT, DPT 01/18/2016 Pager: [phone]

## 2016-01-18 NOTE — Evaluation (Signed)
Occupational Therapy Evaluation Patient Details Name: Cheryl Davidson MRN: [id number] DOB: [birth year] Today's Date: 01/18/2016    History of Present Illness Pt is a 60 year old female s/p L ORIF tibial plateau   Clinical Impression   Pt with decline in function and safety with ADLs and ADL mobility with decreased balance and endurance. Pt would benefit from acute OT services to address impairments to increase level of function and safety    Follow Up Recommendations  No OT follow up;Supervision - Intermittent    Equipment Recommendations  Other (comment) (reacher, LH sponge)    Recommendations for Other Services       Precautions / Restrictions Precautions Precautions: Fall Required Braces or Orthoses: Knee Immobilizer - Left Knee Immobilizer - Left: On when out of bed or walking Restrictions Weight Bearing Restrictions: Yes LLE Weight Bearing: Non weight bearing      Mobility Bed Mobility Overal bed mobility: Needs Assistance Bed Mobility: Supine to Sit     Supine to sit: Supervision     General bed mobility comments: pt up in recliner  Transfers Overall transfer level: Needs assistance Equipment used: Rolling walker (2 wheeled) Transfers: Sit to/from Stand Sit to Stand: Min guard         General transfer comment: verbal cues for UE and LE positioning, pt aware of NWB status - has been performing this prior to surgery    Balance Overall balance assessment: Needs assistance   Sitting balance-Leahy Scale: Good       Standing balance-Leahy Scale: Fair                              ADL Overall ADL's : Needs assistance/impaired     Grooming: Wash/dry hands;Wash/dry face;Min guard;Standing   Upper Body Bathing: Set up;Sitting   Lower Body Bathing: Moderate assistance   Upper Body Dressing : Set up;Sitting   Lower Body Dressing: Moderate assistance   Toilet Transfer: RW;Ambulation;Comfort height toilet;Grab bars;Min guard    Toileting- Clothing Manipulation and Hygiene: Minimal assistance;Sitting/lateral lean;Sit to/from stand       Functional mobility during ADLs: Min guard;Rolling walker General ADL Comments: Educated pt on ADL A/E for home use     Vision  wears glasses, no change from baseline              Pertinent Vitals/Pain Pain Assessment: 0-10 Pain Score: 7  Pain Location: L knee Pain Descriptors / Indicators: Aching;Throbbing;Sore Pain Intervention(s): Limited activity within patient's tolerance;Monitored during session;Premedicated before session;Repositioned     Hand Dominance Right   Extremity/Trunk Assessment Upper Extremity Assessment Upper Extremity Assessment: Overall WFL for tasks assessed   Lower Extremity Assessment Lower Extremity Assessment: Defer to PT evaluation   Cervical / Trunk Assessment Cervical / Trunk Assessment: Normal   Communication Communication Communication: No difficulties   Cognition Arousal/Alertness: Awake/alert Behavior During Therapy: WFL for tasks assessed/performed Overall Cognitive Status: Within Functional Limits for tasks assessed                     General Comments   pt very pleasant and cooperative                Home Living Family/patient expects to be discharged to:: Private residence Living Arrangements: Alone Available Help at Discharge: Family Type of Home: House Home Access: Stairs to enter Secretary/administratorntrance Stairs-Number of Steps: 2 Entrance Stairs-Rails: None Home Layout: Two level;Able to live on main level with  bedroom/bathroom;1/2 bath on main level     Bathroom Shower/Tub: Tub/shower unit;Walk-in shower   Bathroom Toilet: Standard     Home Equipment: Environmental consultantWalker - 2 wheels;Crutches          Prior Functioning/Environment Level of Independence: Independent with assistive device(s)        Comments: pt has been sleeping in recliner on main level, RW for household, crutches for stairs prior to surgery         OT Problem List: Impaired balance (sitting and/or standing);Pain;Decreased activity tolerance;Decreased knowledge of use of DME or AE   OT Treatment/Interventions: Self-care/ADL training;DME and/or AE instruction;Therapeutic activities;Patient/family education    OT Goals(Current goals can be found in the care plan section) Acute Rehab OT Goals Patient Stated Goal: go home OT Goal Formulation: With patient Time For Goal Achievement: 01/25/16 Potential to Achieve Goals: Good ADL Goals Pt Will Perform Grooming: with supervision;with set-up;standing Pt Will Perform Lower Body Bathing: with min assist;with min guard assist;sitting/lateral leans;sit to/from stand;with caregiver independent in assisting Pt Will Perform Lower Body Dressing: with min assist;with min guard assist;sit to/from stand;sitting/lateral leans;with caregiver independent in assisting Pt Will Transfer to Toilet: with supervision;with modified independence;grab bars;ambulating Pt Will Perform Toileting - Clothing Manipulation and hygiene: with min guard assist;sit to/from stand;sitting/lateral leans;with caregiver independent in assisting  OT Frequency: Min 2X/week   Barriers to D/C:    no barriers, pt's dauhgter will be able to assist her                     End of Session Equipment Utilized During Treatment: Gait belt;Rolling walker;Other (comment) (3 in 1)  Activity Tolerance: Patient tolerated treatment well Patient left: in chair;with call bell/phone within reach   Time: 1207-1231 OT Time Calculation (min): 24 min Charges:  OT General Charges $OT Visit: 1 Procedure OT Evaluation $OT Eval Moderate Complexity: 1 Procedure OT Treatments $Therapeutic Activity: 8-22 mins G-Codes:    Galen ManilaSpencer, Caden Fukushima Jeanette 01/18/2016, 2:05 PM

## 2016-01-18 NOTE — Evaluation (Signed)
Physical Therapy Evaluation Patient Details Name: Cheryl Davidson MRN: [id number] DOB: 04-27-1955 Today's Date: 01/18/2016   History of Present Illness  Pt is a 60 year old female s/p L ORIF tibial plateau  Clinical Impression  Patient is s/p above surgery resulting in functional limitations due to the deficits listed below (see PT Problem List).  Patient will benefit from skilled PT to increase their independence and safety with mobility to allow discharge to the venue listed below.  Pt assisted with mobility and only able to tolerate short distance ambulation at this time.  Pt plans to return home with daughter assisting as needed.     Follow Up Recommendations No PT follow up (PT per surgeon)    Equipment Recommendations  None recommended by PT    Recommendations for Other Services       Precautions / Restrictions Precautions Precautions: Fall Required Braces or Orthoses: Knee Immobilizer - Left Knee Immobilizer - Left: On when out of bed or walking Restrictions Weight Bearing Restrictions: Yes LLE Weight Bearing: Non weight bearing      Mobility  Bed Mobility Overal bed mobility: Needs Assistance Bed Mobility: Supine to Sit     Supine to sit: Supervision     General bed mobility comments: verbal cues for self assist  Transfers Overall transfer level: Needs assistance Equipment used: Rolling walker (2 wheeled) Transfers: Sit to/from Stand Sit to Stand: Min guard         General transfer comment: verbal cues for UE and LE positioning, pt aware of NWB status - has been performing this prior to surgery  Ambulation/Gait Ambulation/Gait assistance: Min guard Ambulation Distance (Feet): 25 Feet Assistive device: Rolling walker (2 wheeled)       General Gait Details: pt maintains NWB well and uses RW safely, distance limited by pain and fatigue  Stairs Stairs:  (pt did not feel able to practice steps at this time)          Wheelchair Mobility     Modified Rankin (Stroke Patients Only)       Balance                                             Pertinent Vitals/Pain Pain Assessment: 0-10 Pain Score: 3  Pain Location: L knee Pain Descriptors / Indicators: Aching;Sore Pain Intervention(s): Limited activity within patient's tolerance;Monitored during session;Repositioned    Home Living Family/patient expects to be discharged to:: Private residence Living Arrangements: Alone Available Help at Discharge: Family (daughter staying with pt upon d/c) Type of Home: House Home Access: Stairs to enter Entrance Stairs-Rails: None Entrance Stairs-Number of Steps: 2 Home Layout: Two level;Able to live on main level with bedroom/bathroom Home Equipment: Walker - 2 wheels;Crutches      Prior Function Level of Independence: Independent with assistive device(s)         Comments: pt has been sleeping in recliner on main level, RW for household, crutches for stairs prior to surgery     Hand Dominance        Extremity/Trunk Assessment               Lower Extremity Assessment: RLE deficits/detail RLE Deficits / Details: maintained KI, able to perform ankle pumps and quad set       Communication   Communication: No difficulties  Cognition Arousal/Alertness: Awake/alert Behavior During Therapy: [facility] CenterWFL for tasks assessed/performed  Overall Cognitive Status: Within Functional Limits for tasks assessed                      General Comments      Exercises Other Exercises Other Exercises: educated pt to perform quad sets 10 reps at least 3-5x/day, also demonstrated gentle patellar mobilization on better R knee   Assessment/Plan    PT Assessment Patient needs continued PT services  PT Problem List Pain;Decreased knowledge of precautions;Decreased mobility;Decreased strength;Decreased knowledge of use of DME          PT Treatment Interventions DME instruction;Gait training;Functional  mobility training;Stair training;Therapeutic exercise;Therapeutic activities;Patient/family education    PT Goals (Current goals can be found in the Care Plan section)  Acute Rehab PT Goals PT Goal Formulation: With patient Time For Goal Achievement: 01/25/16 Potential to Achieve Goals: Good    Frequency Min 5X/week   Barriers to discharge        Co-evaluation               End of Session Equipment Utilized During Treatment: Gait belt Activity Tolerance: Patient tolerated treatment well Patient left: in chair;with call bell/phone within reach Nurse Communication: Mobility status    Functional Assessment Tool Used: clinical judgement Functional Limitation: Mobility: Walking and moving around Mobility: Walking and Moving Around Current Status ([id number]): At least 1 percent but less than 20 percent impaired, limited or restricted Mobility: Walking and Moving Around Goal Status [phone](G8979): At least 1 percent but less than 20 percent impaired, limited or restricted    Time: 725 087 [id number] PT Time Calculation (min) (ACUTE ONLY): 18 min   Charges:   PT Evaluation $PT Eval Low Complexity: 1 Procedure     PT G Codes:   PT G-Codes **NOT FOR INPATIENT CLASS** Functional Assessment Tool Used: clinical judgement Functional Limitation: Mobility: Walking and moving around Mobility: Walking and Moving Around Current Status ([id number](G8978): At least 1 percent but less than 20 percent impaired, limited or restricted Mobility: Walking and Moving Around Goal Status [phone](G8979): At least 1 percent but less than 20 percent impaired, limited or restricted    Cheryl Davidson,Cheryl Davidson 01/18/2016, 10:20 AM Zenovia JarredKati Alegandra Sommers, PT, DPT 01/18/2016 Pager: [phone]

## 2016-01-19 DIAGNOSIS — S82142A Displaced bicondylar fracture of left tibia, initial encounter for closed fracture: Secondary | ICD-10-CM | POA: Diagnosis not present

## 2016-01-19 NOTE — Progress Notes (Signed)
Antonieta LovelessJill K Tagliaferri  MRN: [id number] DOB/Age: November 01, 1955 60 y.o. Physician:Rogers Procedure: Procedure(s) (LRB): OPEN REDUCTION INTERNAL FIXATION (ORIF) TIBIAL PLATEAU (Left)     Subjective: Up in chair. Feels better today than yesterday and up for going home after PT.  Vital Signs Temp:  [97.9 F (36.6 C)-99.7 F (37.6 C)] 98.7 F (37.1 C) (12/02 0609) Pulse Rate:  [69-81] 71 (12/02 0609) Resp:  [17] 17 (12/02 0609) BP: (101-117)/(41-55) 101/41 (12/02 0609) SpO2:  [94 %-98 %] 94 % (12/02 0609)  Lab Results  Recent Labs  01/17/16 1500  WBC 10.5  HGB 11.4*  HCT 34.5*  PLT 428*   BMET  Recent Labs  01/17/16 1500  CREATININE 0.79   INR  Date Value Ref Range Status  01/17/2016 1.00  Final     Exam Dressing changed Staples intact No drainage, new dressing applied        Plan DC home after PT today  [facility]HUFORD,Haisley Arens PA-C   01/19/2016, 10:03 AM Contact # [phone]

## 2016-01-19 NOTE — Progress Notes (Addendum)
Occupational Therapy Treatment Patient Details Name: Cheryl LovelessJill K Davidson MRN: [id number] DOB: 1955/09/02 Today's Date: 01/19/2016    History of present illness Pt is a 60 year old female s/p L ORIF tibial plateau   OT comments  Pt. Reports she will have assistance from daughter available at home with B/D.  Declined need for physical review of b.room transfer.  Reviewed energy conservation strategies and safety as she states she may be in the kitchen intermittently for lunch prep.  Eager for d/c home later today.  States no other questions/concerns for OT.  Will alert OTR/L to sign off.   Follow Up Recommendations  No OT follow up;Supervision - Intermittent    Equipment Recommendations       Recommendations for Other Services      Precautions / Restrictions Precautions Precautions: Fall Required Braces or Orthoses: Knee Immobilizer - Left Knee Immobilizer - Left: On when out of bed or walking Restrictions LLE Weight Bearing: Non weight bearing       Mobility Bed Mobility                  Transfers                      Balance                                   ADL                                         General ADL Comments: pt. reports she was nwb 2 weeks prior to sx. and has developed routines and way to manage ADLS with the help from her dtr. who is living with her now.  declined need for physical review of b.room transfer.  reports she will be sponge bathing initially and her dtr. helps with b/d.  reviewed energy conservation and safety strategies as she reports she will be amb. to kitchen intermittently for lunch throughout the day.  states she has no other OT needs at this time.  eager for d/c home      Vision                     Perception     Praxis      Cognition   Behavior During Therapy: [facility]WFL for tasks assessed/performed Overall Cognitive Status: Within Functional Limits for tasks assessed                        Extremity/Trunk Assessment               Exercises     Shoulder Instructions       General Comments      Pertinent Vitals/ Pain       Pain Assessment: No/denies pain  Home Living                                          Prior Functioning/Environment              Frequency  Min 2X/week        Progress Toward Goals  OT Goals(current goals can now be found in the care plan section)  Progress towards OT goals: Progressing toward goals     Plan Discharge plan remains appropriate    Co-evaluation                 End of Session     Activity Tolerance Patient tolerated treatment well   Patient Left in chair;with call bell/phone within reach   Nurse Communication          Time: 1022-1030 OT Time Calculation (min): 8 min  Charges: OT Treatments $Self Care/Home Management : 8-22 mins  Robet LeuMorris, Maguadalupe Lata Lorraine, COTA/L 01/19/2016, 10:33 AM

## 2016-01-19 NOTE — Progress Notes (Signed)
Physical Therapy Treatment Patient Details Name: Cheryl Davidson MRN: [id number] DOB: 1955-09-13 Today's Date: 01/19/2016    History of Present Illness Pt is a 60 year old female s/p L ORIF tibial plateau    PT Comments    Patient making good progress with mobility.  Increased gt distance with supervision today.  Patient deferred stair training stating she feels comfortable with completing.  Pt eager to d/c home.  Patient safe to D/C from a mobility standpoint based on progression towards goals set on PT eval.    Follow Up Recommendations  No PT follow up     Equipment Recommendations  None recommended by PT    Recommendations for Other Services       Precautions / Restrictions Precautions Precautions: Fall Required Braces or Orthoses: Knee Immobilizer - Left Knee Immobilizer - Left: On when out of bed or walking Restrictions LLE Weight Bearing: Non weight bearing    Mobility  Bed Mobility                  Transfers Overall transfer level: Modified independent Equipment used: Rolling walker (2 wheeled)                Ambulation/Gait Ambulation/Gait assistance: Supervision Ambulation Distance (Feet): 60 Feet Assistive device: Rolling walker (2 wheeled) Gait Pattern/deviations: Step-to pattern     General Gait Details: client maintains NWBing LLE.     Stairs         General stair comments: patient deferred stairs this session stating she feels comfortable with completing them.    Wheelchair Mobility    Modified Rankin (Stroke Patients Only)       Balance                                    Cognition Arousal/Alertness: Awake/alert Behavior During Therapy: WFL for tasks assessed/performed Overall Cognitive Status: Within Functional Limits for tasks assessed                      Exercises      General Comments        Pertinent Vitals/Pain Pain Assessment: 0-10 Pain Score: 3  Pain Location: LLE Pain  Descriptors / Indicators: Aching Pain Intervention(s): Limited activity within patient's tolerance;Monitored during session    Home Living                      Prior Function            PT Goals (current goals can now be found in the care plan section) Acute Rehab PT Goals Patient Stated Goal: to go home PT Goal Formulation: With patient Time For Goal Achievement: 01/25/16 Potential to Achieve Goals: Good Progress towards PT goals: Progressing toward goals    Frequency    Min 5X/week      PT Plan Current plan remains appropriate    Co-evaluation             End of Session Equipment Utilized During Treatment: Gait belt;Left knee immobilizer Activity Tolerance: Patient tolerated treatment well Patient left: in chair;with call bell/phone within reach     Time: 1042-1052 PT Time Calculation (min) (ACUTE ONLY): 10 min  Charges:  $Gait Training: 8-22 mins                    G Codes:      Lara MulchCooper, Kainoah Bartosiewicz Lynn 01/19/2016,  11:14 AM   Verdell FaceKelly Fitzroy Mikami, PTA [phone] 01/19/2016

## 2016-02-10 NOTE — Discharge Summary (Signed)
Patient ID: Cheryl Davidson MRN: [id number] DOB/AGE: 59-Nov-1957 59 y.o.  Admit date: 01/17/2016 Discharge date: 01/19/2016  Primary Diagnosis: Left bicondylar tibial plateau fracture  Admission Diagnoses:  History reviewed. No pertinent past medical history. Discharge Diagnoses:   Principal Problem:   Bicondylar fracture of left tibia  Estimated body mass index is 25.7 kg/m as calculated from the following:   Height as of 01/15/16: 5' 5.75" (1.67 m).   Weight as of this encounter: 71.7 kg (158 lb).  Procedure:  Procedure(s) (LRB): OPEN REDUCTION INTERNAL FIXATION (ORIF) TIBIAL PLATEAU (Left)   Consults: None  HPI: Cheryl Davidson sustained a bicondylar tibial plateau fracture and was indicated for surgical management.  She presented to [facility] for operative care.  Laboratory Data: Admission on 01/17/2016, Discharged on 01/19/2016  Component Date Value Ref Range Status  . Prothrombin Time 01/17/2016 13.2  11.4 - 15.2 seconds Final  . INR 01/17/2016 1.00   Final  . WBC 01/17/2016 10.5  4.0 - 10.5 K/uL Final  . RBC 01/17/2016 3.89  3.87 - 5.11 MIL/uL Final  . Hemoglobin 01/17/2016 11.4* 12.0 - 15.0 g/dL Final  . HCT 01/17/2016 34.5* 36.0 - 46.0 % Final  . MCV 01/17/2016 88.7  78.0 - 100.0 fL Final  . MCH 01/17/2016 29.3  26.0 - 34.0 pg Final  . MCHC 01/17/2016 33.0  30.0 - 36.0 g/dL Final  . RDW 01/17/2016 13.7  11.5 - 15.5 % Final  . Platelets 01/17/2016 428* 150 - 400 K/uL Final  . Creatinine, Ser 01/17/2016 0.79  0.44 - 1.00 mg/dL Final  . GFR calc non Af Amer 01/17/2016 >60  >60 mL/min Final  . GFR calc Af Amer 01/17/2016 >60  >60 mL/min Final   Comment: (NOTE) The eGFR has been calculated using the CKD EPI equation. This calculation has not been validated in all clinical situations. eGFR's persistently <60 mL/min signify possible Chronic Kidney Disease.   Hospital Outpatient Visit on 01/15/2016  Component Date Value Ref Range Status  . WBC 01/15/2016 7.2  4.0 - 10.5  K/uL Final  . RBC 01/15/2016 4.30  3.87 - 5.11 MIL/uL Final  . Hemoglobin 01/15/2016 12.9  12.0 - 15.0 g/dL Final  . HCT 01/15/2016 38.5  36.0 - 46.0 % Final  . MCV 01/15/2016 89.5  78.0 - 100.0 fL Final  . MCH 01/15/2016 30.0  26.0 - 34.0 pg Final  . MCHC 01/15/2016 33.5  30.0 - 36.0 g/dL Final  . RDW 01/15/2016 13.8  11.5 - 15.5 % Final  . Platelets 01/15/2016 439* 150 - 400 K/uL Final     X-Rays:Dg Tibia/fibula Left Port  Result Date: 01/17/2016 CLINICAL DATA:  Postop fixation of left tibial plateau fracture. Initial encounter EXAM: PORTABLE LEFT TIBIA AND FIBULA - 2 VIEW COMPARISON:  Fluoroscopy from earlier today FINDINGS: 2 plate and screw fixation of the tibial plateau fracture with improved central depression compared to prior. The tibial plateau remains laterally subluxed and the medial compartment widened. Expected joint fluid and gas. IMPRESSION: 1. Interval ORIF of the tibial plateau fracture. 2. Lateral subluxation of the tibial plateau and medial compartment widening. Electronically Signed   By: Monte Fantasia M.D.   On: 01/17/2016 12:11   Dg C-arm 61-120 Min  Result Date: 01/17/2016 CLINICAL DATA:  ORIF left tibial plateau fracture. EXAM: LEFT KNEE - 3 VIEW; DG C-ARM 61-120 MIN COMPARISON:  Left knee CT - 01/04/2016 FINDINGS: Four spot intraoperative radiographic images of the proximal aspect the left tibia are provided for review  Images demonstrate the sequela of posterior and lateral sideplate fixation of comminuted displaced tibial plateau fracture. Alignment is improved. No evidence of hardware failure or loosening. Minimal amount of adjacent subcutaneous emphysema. No radiopaque foreign body. IMPRESSION: Post sideplate fixation of comminuted tibial plateau fracture. Electronically Signed   By: Sandi Mariscal M.D.   On: 01/17/2016 11:00   Dg Knee 2 Views Left  Result Date: 01/17/2016 CLINICAL DATA:  ORIF left tibial plateau fracture. EXAM: LEFT KNEE - 3 VIEW; DG C-ARM 61-120  MIN COMPARISON:  Left knee CT - 01/04/2016 FINDINGS: Four spot intraoperative radiographic images of the proximal aspect the left tibia are provided for review Images demonstrate the sequela of posterior and lateral sideplate fixation of comminuted displaced tibial plateau fracture. Alignment is improved. No evidence of hardware failure or loosening. Minimal amount of adjacent subcutaneous emphysema. No radiopaque foreign body. IMPRESSION: Post sideplate fixation of comminuted tibial plateau fracture. Electronically Signed   By: Sandi Mariscal M.D.   On: 01/17/2016 11:00    EKG:No orders found for this or any previous visit.   Hospital Course: Cheryl Davidson is a 60 y.o. who was admitted to [facility]. They were brought to the operating room on 01/17/2016 and underwent Procedure(s): OPEN REDUCTION INTERNAL FIXATION (ORIF) TIBIAL PLATEAU.  Patient tolerated the procedure well and was later transferred to the recovery room and then to the orthopaedic floor for postoperative care.  They were given PO and IV analgesics for pain control following their surgery.  They were given 24 hours of postoperative antibiotics of  Anti-infectives    Start     Dose/Rate Route Frequency Ordered Stop   01/17/16 0521  ceFAZolin (ANCEF) 2-4 GM/100ML-% IVPB    Comments:  Fabian Sharp   : cabinet override      01/17/16 0521 01/17/16 0729   01/17/16 0511  ceFAZolin (ANCEF) IVPB 2g/100 mL premix     2 g 200 mL/hr over 30 Minutes Intravenous On call to O.R. 01/17/16 [id number] 01/17/16 0759     and started on DVT prophylaxis in the form of Lovenox.   PT and OT were ordered.  Discharge planning consulted to help with postop disposition and equipment needs.  Patient had a good night on the evening of surgery.  They started to get up OOB with therapy on day one.  Continued to work with therapy into day two.  Dressing was changed on day two and the incision was c/d/i.  By day three, the patient had progressed with therapy and  meeting their goals.  Incision was healing well.  Patient was seen in rounds and was ready to go home.   Diet: Regular diet Activity:NWB Follow-up:in 2 weeks Disposition - Home Discharged Condition: good   Discharge Instructions    Call MD / Call 911    Complete by:  As directed    If you experience chest pain or shortness of breath, CALL 911 and be transported to the hospital emergency room.  If you develope a fever above 101 F, pus (white drainage) or increased drainage or redness at the wound, or calf pain, call your surgeon's office.   Constipation Prevention    Complete by:  As directed    Drink plenty of fluids.  Prune juice may be helpful.  You may use a stool softener, such as Colace (over the counter) 100 mg twice a day.  Use MiraLax (over the counter) for constipation as needed.   Diet - low sodium heart healthy  Complete by:  As directed    Discharge instructions    Complete by:  As directed    -keep leg elevated -NWB to Left leg -remove dressing on post op day 4 and replace with clean dry dressing once daily -do not get incisions wet until see by MD -follow up in 2 weeks with surgeon -resume lovenox injections as before surgery for DVT PPX -do not take NSAIDs in you can avoid them, use tylenol and narcotic for pain control with muscle relaxer   Increase activity slowly as tolerated    Complete by:  As directed      Allergies as of 01/19/2016   No Known Allergies     Medication List    TAKE these medications   calcium carbonate 1500 (600 Ca) MG Tabs tablet Commonly known as:  OSCAL Take 600 mg of elemental calcium by mouth 2 (two) times daily with a meal.   docusate sodium 100 MG capsule Commonly known as:  COLACE Take 1 capsule (100 mg total) by mouth 2 (two) times daily.   enoxaparin 40 MG/0.4ML injection Commonly known as:  LOVENOX INJECT 0.4 ML (1 SYRINGE) DAILY AS DIRECTED   methocarbamol 500 MG tablet Commonly known as:  ROBAXIN Take 1 tablet  (500 mg total) by mouth every 6 (six) hours as needed for muscle spasms.   oxyCODONE 5 MG immediate release tablet Commonly known as:  Oxy IR/ROXICODONE Take 1-2 tablets (5-10 mg total) by mouth every 3 (three) hours as needed for breakthrough pain.   oxyCODONE-acetaminophen 5-325 MG tablet Commonly known as:  PERCOCET/ROXICET Take 1 tablet by mouth every 4 (four) hours as needed for moderate pain. for pain   promethazine 12.5 MG tablet Commonly known as:  PHENERGAN Take 1 tablet (12.5 mg total) by mouth every 6 (six) hours as needed for nausea or vomiting.   Vitamin D3 1000 units Caps Take 1,000 Units by mouth daily.      Follow-up Information    Nicholes Stairs, MD. Schedule an appointment as soon as possible for a visit in 2 week(s).   Specialty:  Orthopedic Surgery Contact information: [address] [place] [address] [phone]           Signed: Geralynn Rile, MD Orthopaedic Surgery 02/10/2016, 7:28 AM

## 2016-07-25 ENCOUNTER — Other Ambulatory Visit: Payer: Self-pay | Admitting: Family Medicine

## 2016-07-25 DIAGNOSIS — Z1231 Encounter for screening mammogram for malignant neoplasm of breast: Secondary | ICD-10-CM

## 2016-08-19 ENCOUNTER — Ambulatory Visit
Admission: RE | Admit: 2016-08-19 | Discharge: 2016-08-19 | Disposition: A | Discharge status: Home / Self Care | Payer: BC Managed Care – PPO | Source: Ambulatory Visit | Attending: Family Medicine | Admitting: Family Medicine

## 2016-08-19 DIAGNOSIS — Z1231 Encounter for screening mammogram for malignant neoplasm of breast: Secondary | ICD-10-CM

## 2016-08-21 ENCOUNTER — Other Ambulatory Visit: Payer: Self-pay | Admitting: Family Medicine

## 2016-08-21 DIAGNOSIS — R928 Other abnormal and inconclusive findings on diagnostic imaging of breast: Secondary | ICD-10-CM

## 2016-08-27 ENCOUNTER — Ambulatory Visit
Admission: RE | Admit: 2016-08-27 | Discharge: 2016-08-27 | Disposition: A | Discharge status: Home / Self Care | Payer: BC Managed Care – PPO | Source: Ambulatory Visit | Attending: Family Medicine | Admitting: Family Medicine

## 2016-08-27 DIAGNOSIS — R928 Other abnormal and inconclusive findings on diagnostic imaging of breast: Secondary | ICD-10-CM

## 2017-08-25 ENCOUNTER — Other Ambulatory Visit: Payer: Self-pay | Admitting: Family Medicine

## 2017-08-25 DIAGNOSIS — Z1231 Encounter for screening mammogram for malignant neoplasm of breast: Secondary | ICD-10-CM

## 2017-09-15 ENCOUNTER — Ambulatory Visit
Admission: RE | Admit: 2017-09-15 | Discharge: 2017-09-15 | Disposition: A | Discharge status: Home / Self Care | Payer: BC Managed Care – PPO | Source: Ambulatory Visit | Attending: Family Medicine | Admitting: Family Medicine

## 2017-09-15 DIAGNOSIS — Z1231 Encounter for screening mammogram for malignant neoplasm of breast: Secondary | ICD-10-CM

## 2018-09-15 ENCOUNTER — Other Ambulatory Visit: Payer: Self-pay | Admitting: Family Medicine

## 2018-09-15 DIAGNOSIS — Z1231 Encounter for screening mammogram for malignant neoplasm of breast: Secondary | ICD-10-CM

## 2018-10-28 ENCOUNTER — Ambulatory Visit
Admission: RE | Admit: 2018-10-28 | Discharge: 2018-10-28 | Disposition: A | Discharge status: Home / Self Care | Payer: BC Managed Care – PPO | Source: Ambulatory Visit | Attending: Family Medicine | Admitting: Family Medicine

## 2018-10-28 ENCOUNTER — Other Ambulatory Visit: Payer: Self-pay

## 2018-10-28 DIAGNOSIS — Z1231 Encounter for screening mammogram for malignant neoplasm of breast: Secondary | ICD-10-CM

## 2019-09-22 ENCOUNTER — Other Ambulatory Visit: Payer: Self-pay | Admitting: Family Medicine

## 2019-09-22 DIAGNOSIS — Z1231 Encounter for screening mammogram for malignant neoplasm of breast: Secondary | ICD-10-CM

## 2019-10-05 ENCOUNTER — Ambulatory Visit: Payer: BC Managed Care – PPO | Admitting: Podiatry

## 2019-10-05 ENCOUNTER — Other Ambulatory Visit: Payer: Self-pay

## 2019-10-05 DIAGNOSIS — M79674 Pain in right toe(s): Secondary | ICD-10-CM

## 2019-10-05 DIAGNOSIS — B351 Tinea unguium: Secondary | ICD-10-CM | POA: Diagnosis not present

## 2019-10-05 DIAGNOSIS — L601 Onycholysis: Secondary | ICD-10-CM | POA: Diagnosis not present

## 2019-10-05 DIAGNOSIS — M79675 Pain in left toe(s): Secondary | ICD-10-CM | POA: Diagnosis not present

## 2019-10-05 MED ORDER — TERBINAFINE HCL 250 MG PO TABS: 250.0000 mg | ORAL_TABLET | Freq: Every day | ORAL | 0 refills | Status: AC

## 2019-10-05 NOTE — Patient Instructions (Signed)

## 2019-10-05 NOTE — Progress Notes (Signed)
  Subjective:  Patient ID: Cheryl Davidson, female    DOB: 1955-10-06,  MRN: [id number]  Chief Complaint  Patient presents with  . Nail Problem    Left 1st nail raising off nailbed, pt states possibly due to recent nail infection, requests nail removal.   . Nail Problem    Pt states interest in antifungal therapy and that she has had success with Lamisil previously.  . Nail Problem    Pt states right 1st toenail is curling.    64 y.o. female presents with the above complaint. History confirmed with patient.   Objective:  Physical Exam: warm, good capillary refill, no trophic changes or ulcerative lesions, normal DP and PT pulses and normal sensory exam. Left Foot: Mycotic nails x5, the hallux nail has completely lysed off except for the lateral one third Right Foot: Mycotic nails x5 and the hallux nail is curving upward  Assessment:   1. Onychomycosis   2. Pain due to onychomycosis of toenails of both feet   3. Onycholysis      Plan:  Patient was evaluated and treated and all questions answered.   -Patient elects to proceed with left hallux toenail removal today -Left hallux nail excised. See procedure note. -Educated on post-procedure care including soaking. Written instructions provided. -Prescription sent for 90-day course of Lamisil this was helpful for her before. -Recommend she get LFTs checked in 6 weeks.  I gave her an order for this today she is also getting blood work done at her PCP around the same time and this would be fine. -Return in 3 months for follow-up  Procedure: Avulsion of toenail Location: Left 1st toe  Anesthesia: Lidocaine 1% plain; 1.5 mL and Marcaine 0.5% plain; 1.5 mL, digital block. Skin Prep: Betadine. Dressing: Silvadene; telfa; dry, sterile, compression dressing. Technique: Following skin prep, the toe was exsanguinated and a tourniquet was secured at the base of the toe. The nail was freed and avulsed with a hemostat. The area was  cleansed. The tourniquet was then removed and sterile dressing applied. Disposition: Patient tolerated procedure well.    No follow-ups on file.

## 2019-10-31 ENCOUNTER — Other Ambulatory Visit: Payer: Self-pay

## 2019-10-31 ENCOUNTER — Ambulatory Visit
Admission: RE | Admit: 2019-10-31 | Discharge: 2019-10-31 | Disposition: A | Discharge status: Home / Self Care | Payer: BC Managed Care – PPO | Source: Ambulatory Visit | Attending: Family Medicine | Admitting: Family Medicine

## 2019-10-31 DIAGNOSIS — Z1231 Encounter for screening mammogram for malignant neoplasm of breast: Secondary | ICD-10-CM

## 2019-11-18 ENCOUNTER — Other Ambulatory Visit: Payer: Self-pay | Admitting: Podiatry

## 2019-11-19 LAB — HEPATIC FUNCTION PANEL
ALT: 43 IU/L — ABNORMAL HIGH (ref 0–32)
AST: 30 IU/L (ref 0–40)
Albumin: 4.6 g/dL (ref 3.8–4.8)
Alkaline Phosphatase: 121 IU/L (ref 44–121)
Bilirubin Total: 0.2 mg/dL (ref 0.0–1.2)
Bilirubin, Direct: <0.1 mg/dL (ref 0.00–0.40)
Total Protein: 7.5 g/dL (ref 6.0–8.5)

## 2019-11-19 LAB — SPECIMEN STATUS REPORT

## 2019-11-21 ENCOUNTER — Telehealth: Payer: Self-pay | Admitting: Podiatry

## 2019-11-21 NOTE — Telephone Encounter (Signed)
Patient called the office to return your phone call.

## 2019-11-21 NOTE — Telephone Encounter (Signed)
Returned call, she discussed mild elevation in LFTs w/ her PCP and they are fine w/ continuing terbinafine. Will re-check levels after therapy has ceased

## 2020-01-05 ENCOUNTER — Ambulatory Visit: Payer: BC Managed Care – PPO | Admitting: Podiatry

## 2020-01-05 ENCOUNTER — Other Ambulatory Visit: Payer: Self-pay

## 2020-01-05 DIAGNOSIS — B351 Tinea unguium: Secondary | ICD-10-CM

## 2020-01-08 NOTE — Progress Notes (Signed)
  Subjective:  Patient ID: Cheryl Davidson, female    DOB: 1955/11/07,  MRN: [id number]  Chief Complaint  Patient presents with  . Nail Problem    follow up lamisil    64 y.o. female returns with the above complaint. History confirmed with patient.   Objective:  Physical Exam: warm, good capillary refill, no trophic changes or ulcerative lesions, normal DP and PT pulses and normal sensory exam.  Improvement in nail appearance and texture proximal clearing  Assessment:   1. Onychomycosis      Plan:  Patient was evaluated and treated and all questions answered.   -We will let nail continue to grow out.  If there is still residual mycosis we will consider pulsed therapy and topical therapy in the future   No follow-ups on file.

## 2020-04-03 ENCOUNTER — Ambulatory Visit: Payer: BC Managed Care – PPO | Admitting: Podiatry

## 2020-04-06 ENCOUNTER — Ambulatory Visit: Payer: BC Managed Care – PPO | Admitting: Podiatry

## 2020-04-10 ENCOUNTER — Ambulatory Visit: Payer: BC Managed Care – PPO | Admitting: Podiatry

## 2020-04-30 ENCOUNTER — Encounter: Payer: Self-pay | Admitting: Podiatry

## 2020-04-30 ENCOUNTER — Other Ambulatory Visit: Payer: Self-pay

## 2020-04-30 ENCOUNTER — Ambulatory Visit: Payer: BC Managed Care – PPO | Admitting: Podiatry

## 2020-04-30 DIAGNOSIS — B351 Tinea unguium: Secondary | ICD-10-CM | POA: Diagnosis not present

## 2020-04-30 MED ORDER — TERBINAFINE HCL 250 MG PO TABS: 250.0000 mg | ORAL_TABLET | Freq: Every day | ORAL | 0 refills | Status: DC

## 2020-05-01 NOTE — Progress Notes (Signed)
  Subjective:  Patient ID: Cheryl Davidson, female    DOB: 04/19/1955,  MRN: [id number]  Chief Complaint  Patient presents with  . Nail Problem    Routine foot care     65 y.o. female returns with the above complaint. History confirmed with patient.  She is doing well she noticed a good amount of improvement with the terbinafine after she finished it.  No ill side effects.  Feels like they have gotten a little darker since she was off the medication.  Objective:  Physical Exam: warm, good capillary refill, no trophic changes or ulcerative lesions, normal DP and PT pulses and normal sensory exam.  Improvement in nail appearance and texture proximal clearing, there is some brown discoloration the tips of the toes      Assessment:   1. Onychomycosis      Plan:  Patient was evaluated and treated and all questions answered.   -Discussed with her further treatment options including oral and topical therapy as well as eventual laser therapy if she is not successful with this.  I think it be reasonable for [place] to try 1 more round of terbinafine now that has been a few months and she has had the last course.  Prescription was sent to her pharmacy.  Photographs were taken  Return in about 4 months (around 08/30/2020).

## 2020-05-08 ENCOUNTER — Ambulatory Visit: Payer: BC Managed Care – PPO | Admitting: Podiatry

## 2020-07-21 ENCOUNTER — Other Ambulatory Visit: Payer: Self-pay | Admitting: Podiatry

## 2020-09-03 ENCOUNTER — Ambulatory Visit: Payer: BC Managed Care – PPO | Admitting: Podiatry

## 2020-09-03 ENCOUNTER — Other Ambulatory Visit: Payer: Self-pay

## 2020-09-03 DIAGNOSIS — G5752 Tarsal tunnel syndrome, left lower limb: Secondary | ICD-10-CM

## 2020-09-03 DIAGNOSIS — B351 Tinea unguium: Secondary | ICD-10-CM

## 2020-09-03 NOTE — Progress Notes (Signed)
  Subjective:  Patient ID: Cheryl Davidson, female    DOB: 04-03-1955,  MRN: [id number]  Chief Complaint  Patient presents with   Nail Problem    Thick painful toenails, 4 month follow up    65 y.o. female returns with the above complaint. History confirmed with patient.  Nails are doing much better.  She started having worsening numbness in the left foot under the lesser toes.  This is increased from the plantar arch and ball of the foot which started after having tibial plateau fracture surgery several years ago.  Objective:  Physical Exam: warm, good capillary refill, no trophic changes or ulcerative lesions, normal DP and PT pulses and normal sensory exam.   She does have sensation loss in the plantar left foot with a positive Tinel's sign at the tarsal tunnel      Assessment:   1. Onychomycosis   2. Tarsal tunnel syndrome, left      Plan:  Patient was evaluated and treated and all questions answered.   Nails have improved and onychomycosis has resolved at this point no further indication for terbinafine right now  We discussed the numbness she is having which I think is likely tarsal tunnel syndrome secondary to previous surgery and what the treatment and diagnostic options are for this.  Currently only intermittently bothersome.  Discussed treating with a topical agent and NCV and MRI if necessary.  She will continue to monitor this and let me know if becomes more bothersome.  Return if symptoms worsen or fail to improve.

## 2020-09-26 ENCOUNTER — Other Ambulatory Visit: Payer: Self-pay | Admitting: Family Medicine

## 2020-09-26 DIAGNOSIS — Z1231 Encounter for screening mammogram for malignant neoplasm of breast: Secondary | ICD-10-CM

## 2020-11-15 ENCOUNTER — Other Ambulatory Visit: Payer: Self-pay

## 2020-11-15 ENCOUNTER — Ambulatory Visit
Admission: RE | Admit: 2020-11-15 | Discharge: 2020-11-15 | Disposition: A | Discharge status: Home / Self Care | Payer: BC Managed Care – PPO | Source: Ambulatory Visit | Attending: Family Medicine | Admitting: Family Medicine

## 2020-11-15 DIAGNOSIS — Z1231 Encounter for screening mammogram for malignant neoplasm of breast: Secondary | ICD-10-CM

## 2021-08-30 ENCOUNTER — Encounter: Payer: Self-pay | Admitting: Podiatry

## 2021-08-30 ENCOUNTER — Ambulatory Visit: Payer: Medicare PPO | Admitting: Podiatry

## 2021-08-30 ENCOUNTER — Ambulatory Visit (INDEPENDENT_AMBULATORY_CARE_PROVIDER_SITE_OTHER): Payer: Medicare PPO

## 2021-08-30 DIAGNOSIS — M779 Enthesopathy, unspecified: Secondary | ICD-10-CM

## 2021-08-30 DIAGNOSIS — M7752 Other enthesopathy of left foot: Secondary | ICD-10-CM

## 2021-08-30 DIAGNOSIS — M2042 Other hammer toe(s) (acquired), left foot: Secondary | ICD-10-CM

## 2021-08-30 MED ORDER — TRIAMCINOLONE ACETONIDE 10 MG/ML IJ SUSP
10.0000 mg | Freq: Once | INTRAMUSCULAR | Status: AC
  Administered 2021-08-30: 10 mg

## 2021-09-02 NOTE — Progress Notes (Signed)
Subjective:   Patient ID: Cheryl Davidson, female   DOB: 66 y.o.   MRN: [id number]   HPI Patient presents stating she is developed a lot of pain around the second metatarsal phalangeal joint left and she feels like the toe has moved in a direction and it is also numb and she wanted to get that checked   ROS      Objective:  Physical Exam  Neurovascular status intact with patient found to have inflammation pain of the second metatarsal phalangeal joint left with medial deviation of the toe of a moderate nature and minimal sagittal plane deformity currently     Assessment:  Probability that there is an inflammatory capsulitis of the second MPJ left along with possibility for flexor plate stretching or tear of the second MPJ     Plan:  H&P x-ray reviewed and today I went ahead explained to her this condition and the possibility of the toe may need to be stabilized.  Do not see an issue so far with numbness but I am concerned about the discomfort and I did do a periarticular injection 3 mg dexamethasone Kenalog 5 mg Xylocaine advised on rigid bottom shoes and reappoint to see the results in approximate 3 weeks.  Encouraged to call if any questions concerns should arise  X-rays indicate that there is medial deviation of the left second digit no other pathology arthritis stress fracture noted

## 2021-09-13 ENCOUNTER — Ambulatory Visit: Payer: Medicare PPO | Admitting: Podiatry

## 2021-09-20 ENCOUNTER — Encounter: Payer: Self-pay | Admitting: Podiatry

## 2021-09-20 ENCOUNTER — Ambulatory Visit: Payer: Medicare PPO | Admitting: Podiatry

## 2021-09-20 DIAGNOSIS — Q828 Other specified congenital malformations of skin: Secondary | ICD-10-CM | POA: Diagnosis not present

## 2021-09-20 DIAGNOSIS — M2042 Other hammer toe(s) (acquired), left foot: Secondary | ICD-10-CM

## 2021-09-21 NOTE — Progress Notes (Signed)
Subjective:   Patient ID: Greggory Keen, female   DOB: 66 y.o.   MRN: [id number]   HPI Patient presents stating that the joint we worked on is feeling quite a bit better but there is a lesion underneath the left foot that is painful and making it hard to walk and also she is concerned about hammertoe deformity and whether correction will be necessary   ROS      Objective:  Physical Exam  Neurovascular status intact good digital perfusion noted pain in the second MPJ left has reduced with fluid buildup that is better than previous with digital deformities elevated lesser digits with redness around the second digit dorsal proximal phalanx     Assessment:  Structural deformity of the left digits especially second toe along with hammertoe deformity of the second digit left foot and moderate on the adjacent toes.  Patient does have a small keratotic lesion plantar aspect of the second metatarsal left     Plan:  H&P reviewed all conditions and at this point I debrided the plantar callus and no angiogenic bleeding noted discussed hammertoes and the possibility for digital fusion educating her on deformity and what could be done.  Patient's discharge will be seen back as needed

## 2021-11-27 ENCOUNTER — Other Ambulatory Visit: Payer: Self-pay | Admitting: Family Medicine

## 2021-11-27 DIAGNOSIS — Z1231 Encounter for screening mammogram for malignant neoplasm of breast: Secondary | ICD-10-CM

## 2022-01-01 ENCOUNTER — Ambulatory Visit
Admission: RE | Admit: 2022-01-01 | Discharge: 2022-01-01 | Disposition: A | Discharge status: Home / Self Care | Payer: Medicare PPO | Source: Ambulatory Visit | Attending: Family Medicine | Admitting: Family Medicine

## 2022-01-01 DIAGNOSIS — Z1231 Encounter for screening mammogram for malignant neoplasm of breast: Secondary | ICD-10-CM

## 2022-01-14 ENCOUNTER — Other Ambulatory Visit: Payer: Self-pay | Admitting: Family Medicine

## 2022-01-14 DIAGNOSIS — E2839 Other primary ovarian failure: Secondary | ICD-10-CM

## 2022-03-06 ENCOUNTER — Ambulatory Visit
Admission: RE | Admit: 2022-03-06 | Discharge: 2022-03-06 | Disposition: A | Discharge status: Home / Self Care | Payer: Medicare PPO | Source: Ambulatory Visit | Attending: Family Medicine | Admitting: Family Medicine

## 2022-03-06 DIAGNOSIS — E2839 Other primary ovarian failure: Secondary | ICD-10-CM

## 2022-05-14 IMAGING — MG MM DIGITAL SCREENING BILAT W/ TOMO AND CAD
8 series · 8 of 24 positions shown · non-contrast
Comparison: Previous exam(s).

CLINICAL DATA: Screening.

EXAM:
DIGITAL SCREENING BILATERAL MAMMOGRAM WITH TOMOSYNTHESIS AND CAD
TECHNIQUE: Bilateral screening digital craniocaudal and mediolateral oblique
mammograms were obtained. Bilateral screening digital breast
tomosynthesis was performed. The images were evaluated with
computer-aided detection.

[R CC synth-2D]
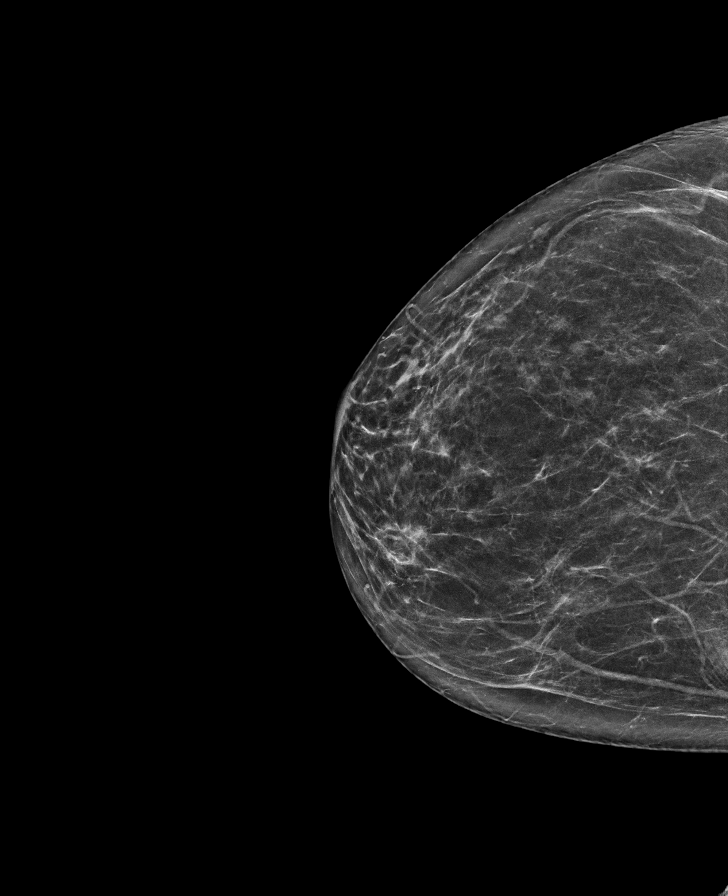

[R MLO synth-2D]
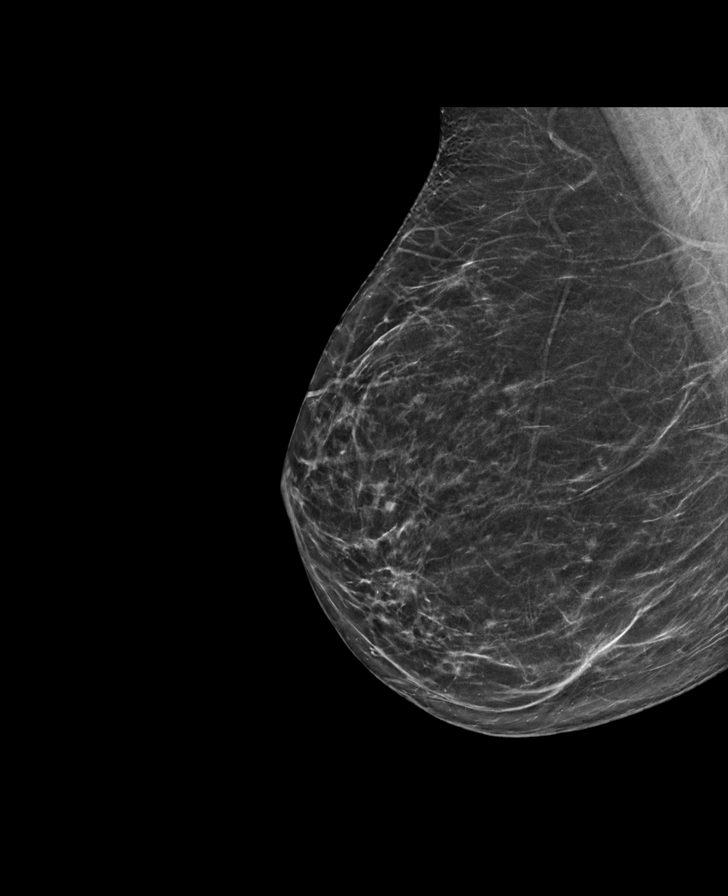

[L CC synth-2D]
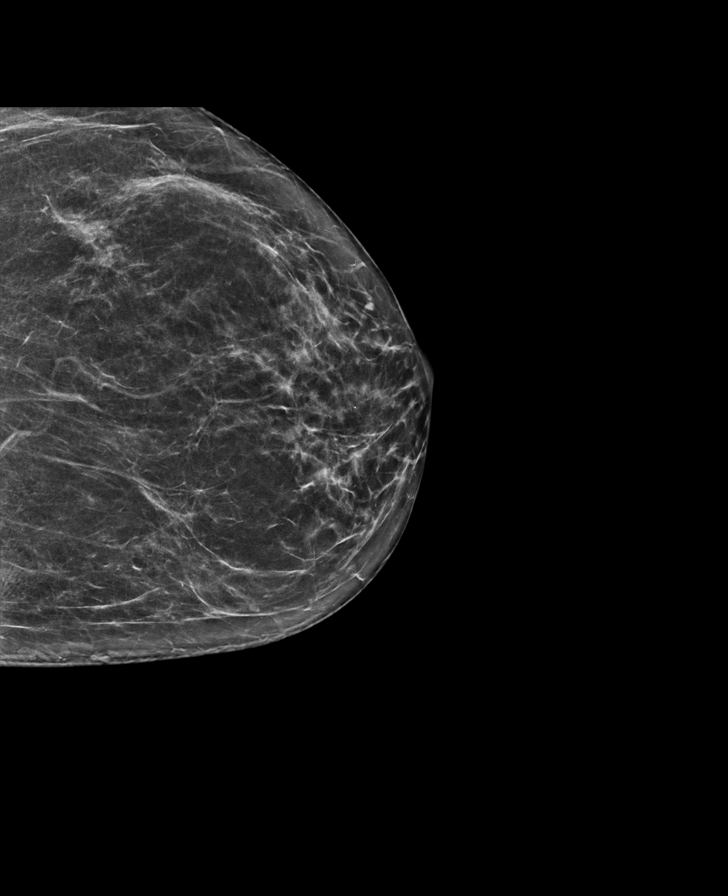

[L MLO synth-2D]
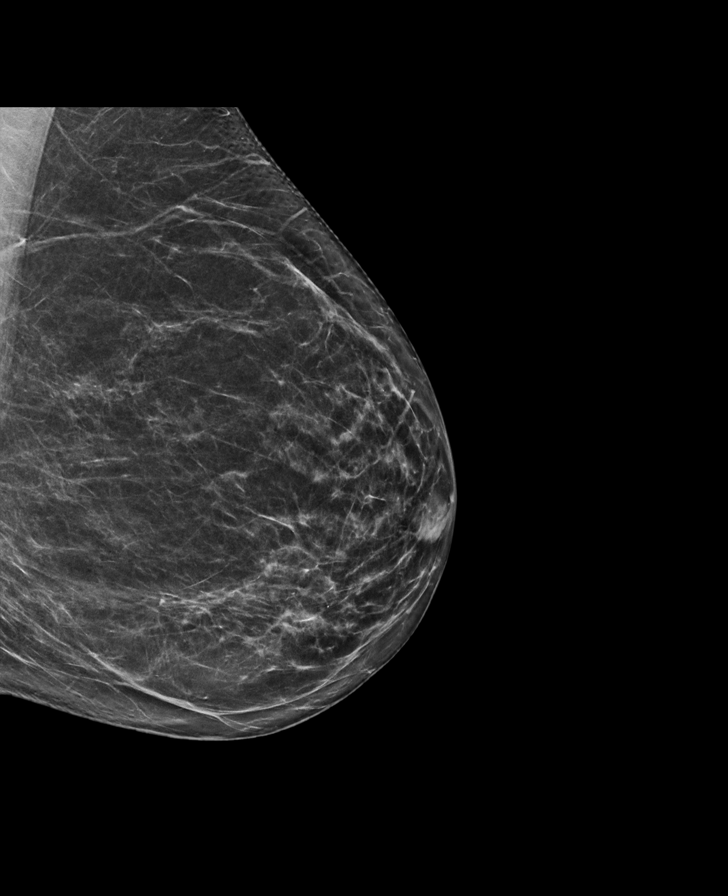

[R CC tomo · tomo slice 33/66.0]
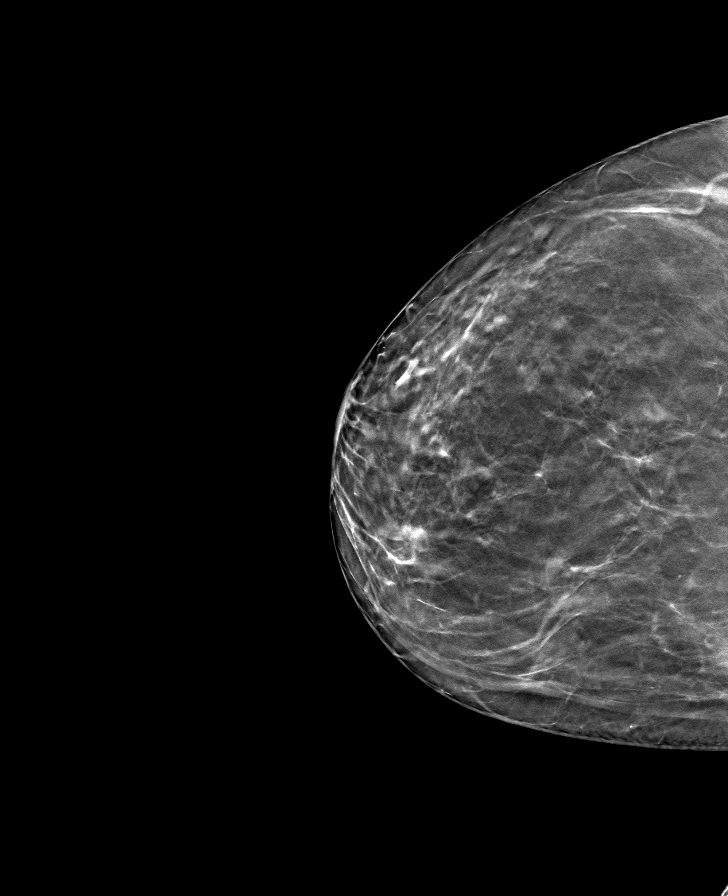

[R MLO tomo · tomo slice 35/68.0]
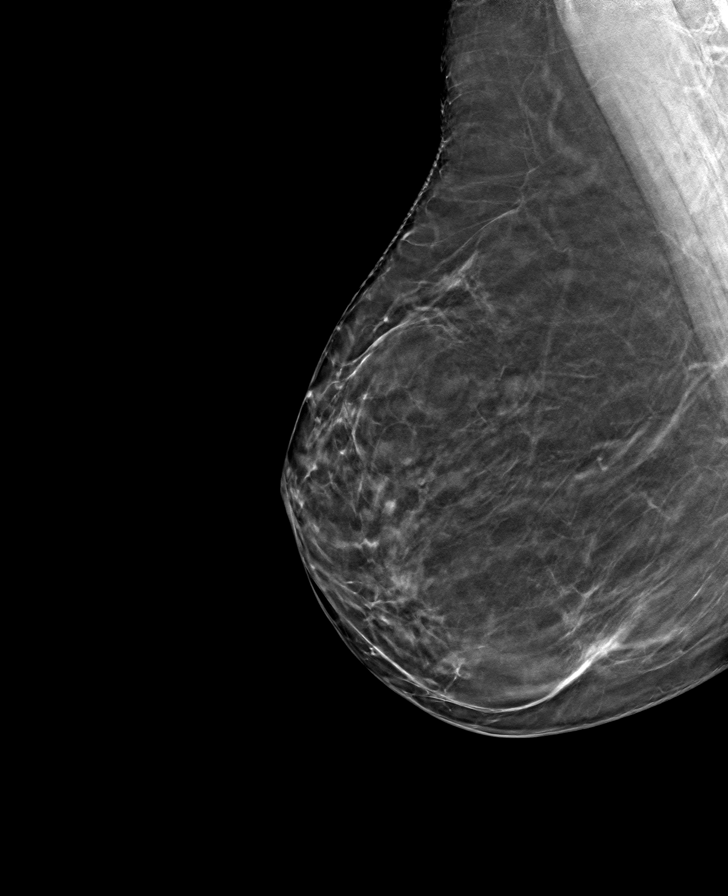

[L CC tomo · tomo slice 35/69.0]
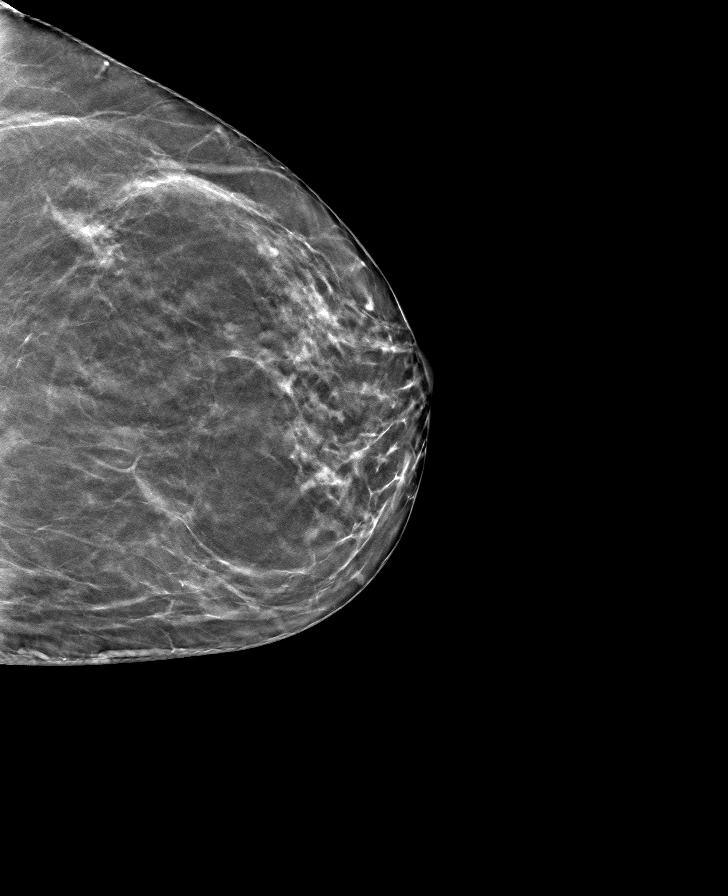

[L MLO tomo · tomo slice 35/68.0]
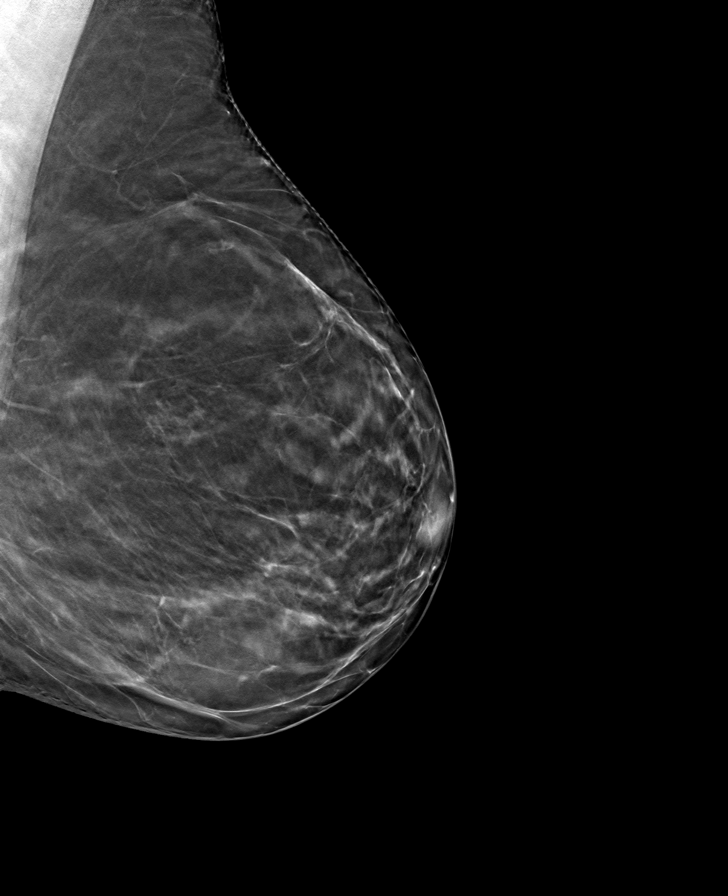

[8 of 24 positions shown; findings below may reference images not displayed]

ACR Breast Density Category b: There are scattered areas of
fibroglandular density.
FINDINGS: There are no findings suspicious for malignancy.
IMPRESSION: No mammographic evidence of malignancy. A result letter of this
screening mammogram will be mailed directly to the patient.

RECOMMENDATION:
Screening mammogram in one year. (Code:51-O-LD2)

BI-RADS CATEGORY  1: Negative.

## 2022-05-20 DIAGNOSIS — M25562 Pain in left knee: Secondary | ICD-10-CM | POA: Diagnosis not present

## 2022-05-20 DIAGNOSIS — M25561 Pain in right knee: Secondary | ICD-10-CM | POA: Diagnosis not present

## 2022-06-02 DIAGNOSIS — H25011 Cortical age-related cataract, right eye: Secondary | ICD-10-CM | POA: Diagnosis not present

## 2022-06-02 DIAGNOSIS — H5203 Hypermetropia, bilateral: Secondary | ICD-10-CM | POA: Diagnosis not present

## 2022-06-02 DIAGNOSIS — H2513 Age-related nuclear cataract, bilateral: Secondary | ICD-10-CM | POA: Diagnosis not present

## 2022-07-01 DIAGNOSIS — M1732 Unilateral post-traumatic osteoarthritis, left knee: Secondary | ICD-10-CM | POA: Diagnosis not present

## 2022-07-01 DIAGNOSIS — M25561 Pain in right knee: Secondary | ICD-10-CM | POA: Diagnosis not present

## 2022-10-27 ENCOUNTER — Ambulatory Visit (INDEPENDENT_AMBULATORY_CARE_PROVIDER_SITE_OTHER): Payer: Medicare PPO

## 2022-10-27 ENCOUNTER — Ambulatory Visit: Payer: Medicare PPO | Admitting: Podiatry

## 2022-10-27 ENCOUNTER — Encounter: Payer: Self-pay | Admitting: Podiatry

## 2022-10-27 DIAGNOSIS — M7752 Other enthesopathy of left foot: Secondary | ICD-10-CM | POA: Diagnosis not present

## 2022-10-27 MED ORDER — TRIAMCINOLONE ACETONIDE 10 MG/ML IJ SUSP
10.0000 mg | Freq: Once | INTRAMUSCULAR | Status: AC
  Administered 2022-10-27: 10 mg via INTRA_ARTICULAR

## 2022-10-27 NOTE — Progress Notes (Signed)
Subjective:   Patient ID: Cheryl Davidson, female   DOB: 67 y.o.   MRN: [id number]   HPI Patient states that she has had a lot of pain recently in the second toe joint and the second toe has moved more in medial direction   ROS      Objective:  Physical Exam  Neurovascular status intact inflammation pain second MPJ left fluid buildup around the joint surface     Assessment:  Strong possibility for flexor plate stretch or dislocation second MPJ left with pain     Plan:  H&P x-ray showed further migration of the toe going to try conservative but ultimately may require different treatment and today I did proximal nerve block aspirated the second MPJ getting out a small amount of clear fluid injected quarter cc dexamethasone Kenalog and reappoint to recheck again with padding and rigid bottom shoes advised

## 2022-11-17 ENCOUNTER — Ambulatory Visit: Payer: Medicare PPO | Admitting: Podiatry

## 2022-11-17 ENCOUNTER — Encounter: Payer: Self-pay | Admitting: Podiatry

## 2022-11-17 VITALS — BP 134/74 | HR 74

## 2022-11-17 DIAGNOSIS — M7752 Other enthesopathy of left foot: Secondary | ICD-10-CM | POA: Diagnosis not present

## 2022-11-17 NOTE — Progress Notes (Signed)
Subjective:   Patient ID: Cheryl Davidson, female   DOB: 67 y.o.   MRN: [id number]   HPI Patient presents stating she is getting a lot of throbbing still at nighttime and her second joint but it is better during the day than it was previously   ROS      Objective:  Physical Exam  Neuro vascular status intact inflammation still present second MPJ improved with medial deviation second toe     Assessment:  Inflammatory capsulitis second digit left foot with probable flexor plate pathology     Plan:  H&P reviewed and at this point organ to keep the toes together at night and switch Mobic tonight and discussed the possibility for digital and metatarsal repair depending on response.  Reappoint to recheck 4 weeks earlier if needed

## 2022-11-21 ENCOUNTER — Other Ambulatory Visit: Payer: Self-pay | Admitting: Family Medicine

## 2022-11-21 DIAGNOSIS — Z1231 Encounter for screening mammogram for malignant neoplasm of breast: Secondary | ICD-10-CM

## 2022-12-15 ENCOUNTER — Ambulatory Visit: Payer: Medicare PPO | Admitting: Podiatry

## 2022-12-15 ENCOUNTER — Encounter: Payer: Self-pay | Admitting: Podiatry

## 2022-12-15 DIAGNOSIS — M2042 Other hammer toe(s) (acquired), left foot: Secondary | ICD-10-CM

## 2022-12-15 DIAGNOSIS — M7752 Other enthesopathy of left foot: Secondary | ICD-10-CM | POA: Diagnosis not present

## 2022-12-15 DIAGNOSIS — M7751 Other enthesopathy of right foot: Secondary | ICD-10-CM | POA: Diagnosis not present

## 2022-12-15 NOTE — Progress Notes (Signed)
Subjective:   Patient ID: Cheryl Davidson, female   DOB: 67 y.o.   MRN: [id number]   HPI Patient states she still has pain around the second MPJ that is improved over where it was but still bothersome   ROS      Objective:  Physical Exam  Neurovascular status intact with significant medial displacement second digit left over right with inflammation fluid of the second MPJ     Assessment:  Inflammatory capsulitis second MPJ left over right with probability for flexor plate deformity     Plan:  H&P reviewed at this point I have recommended orthotics to offload weight and did discuss the possibility of eventually for transpositional osteotomy and possible digital stabilization.  Patient is casted by pedorthist for functional orthotic devices and will be seen back when returned

## 2023-01-05 ENCOUNTER — Ambulatory Visit
Admission: RE | Admit: 2023-01-05 | Discharge: 2023-01-05 | Disposition: A | Discharge status: Home / Self Care | Payer: Medicare PPO | Source: Ambulatory Visit | Attending: Family Medicine | Admitting: Family Medicine

## 2023-01-05 DIAGNOSIS — Z1231 Encounter for screening mammogram for malignant neoplasm of breast: Secondary | ICD-10-CM | POA: Diagnosis not present

## 2023-01-12 ENCOUNTER — Other Ambulatory Visit: Payer: Medicare PPO

## 2023-01-21 DIAGNOSIS — S62632B Displaced fracture of distal phalanx of right middle finger, initial encounter for open fracture: Secondary | ICD-10-CM | POA: Diagnosis not present

## 2023-01-21 DIAGNOSIS — S52502A Unspecified fracture of the lower end of left radius, initial encounter for closed fracture: Secondary | ICD-10-CM | POA: Diagnosis not present

## 2023-01-22 DIAGNOSIS — Z0000 Encounter for general adult medical examination without abnormal findings: Secondary | ICD-10-CM | POA: Diagnosis not present

## 2023-01-22 DIAGNOSIS — E7800 Pure hypercholesterolemia, unspecified: Secondary | ICD-10-CM | POA: Diagnosis not present

## 2023-01-22 DIAGNOSIS — Z1331 Encounter for screening for depression: Secondary | ICD-10-CM | POA: Diagnosis not present

## 2023-01-22 DIAGNOSIS — Z9181 History of falling: Secondary | ICD-10-CM | POA: Diagnosis not present

## 2023-01-23 DIAGNOSIS — S66322A Laceration of extensor muscle, fascia and tendon of right middle finger at wrist and hand level, initial encounter: Secondary | ICD-10-CM | POA: Diagnosis not present

## 2023-01-23 DIAGNOSIS — S52572A Other intraarticular fracture of lower end of left radius, initial encounter for closed fracture: Secondary | ICD-10-CM | POA: Diagnosis not present

## 2023-01-23 DIAGNOSIS — S62632B Displaced fracture of distal phalanx of right middle finger, initial encounter for open fracture: Secondary | ICD-10-CM | POA: Diagnosis not present

## 2023-01-23 DIAGNOSIS — S62521B Displaced fracture of distal phalanx of right thumb, initial encounter for open fracture: Secondary | ICD-10-CM | POA: Diagnosis not present

## 2023-01-28 DIAGNOSIS — M79644 Pain in right finger(s): Secondary | ICD-10-CM | POA: Diagnosis not present

## 2023-01-28 DIAGNOSIS — S62632B Displaced fracture of distal phalanx of right middle finger, initial encounter for open fracture: Secondary | ICD-10-CM | POA: Diagnosis not present

## 2023-01-28 DIAGNOSIS — S52502A Unspecified fracture of the lower end of left radius, initial encounter for closed fracture: Secondary | ICD-10-CM | POA: Diagnosis not present

## 2023-02-06 ENCOUNTER — Other Ambulatory Visit: Payer: Medicare PPO

## 2023-02-09 DIAGNOSIS — S52502D Unspecified fracture of the lower end of left radius, subsequent encounter for closed fracture with routine healing: Secondary | ICD-10-CM | POA: Diagnosis not present

## 2023-02-09 DIAGNOSIS — S62632B Displaced fracture of distal phalanx of right middle finger, initial encounter for open fracture: Secondary | ICD-10-CM | POA: Diagnosis not present

## 2023-02-09 DIAGNOSIS — G8918 Other acute postprocedural pain: Secondary | ICD-10-CM | POA: Diagnosis not present

## 2023-02-23 DIAGNOSIS — S62632B Displaced fracture of distal phalanx of right middle finger, initial encounter for open fracture: Secondary | ICD-10-CM | POA: Diagnosis not present

## 2023-02-23 DIAGNOSIS — G8918 Other acute postprocedural pain: Secondary | ICD-10-CM | POA: Diagnosis not present

## 2023-02-23 DIAGNOSIS — S52502A Unspecified fracture of the lower end of left radius, initial encounter for closed fracture: Secondary | ICD-10-CM | POA: Diagnosis not present

## 2023-03-05 ENCOUNTER — Ambulatory Visit: Payer: Medicare PPO

## 2023-03-05 ENCOUNTER — Other Ambulatory Visit: Payer: Medicare PPO

## 2023-03-05 NOTE — Progress Notes (Signed)
Patient presents today to pick up custom molded foot orthotics, diagnosed with Capsulitis by Dr. Charlsie Merles.   Orthotics were dispensed and fit was satisfactory. Reviewed instructions for break-in and wear. Written instructions given to patient.  Patient will follow up as needed.   Addison Bailey CPed, CFo, CFm

## 2023-03-09 DIAGNOSIS — M25632 Stiffness of left wrist, not elsewhere classified: Secondary | ICD-10-CM | POA: Diagnosis not present

## 2023-03-09 DIAGNOSIS — S52502D Unspecified fracture of the lower end of left radius, subsequent encounter for closed fracture with routine healing: Secondary | ICD-10-CM | POA: Diagnosis not present

## 2023-03-09 DIAGNOSIS — S62632B Displaced fracture of distal phalanx of right middle finger, initial encounter for open fracture: Secondary | ICD-10-CM | POA: Diagnosis not present

## 2023-03-16 DIAGNOSIS — M25632 Stiffness of left wrist, not elsewhere classified: Secondary | ICD-10-CM | POA: Diagnosis not present

## 2023-03-24 DIAGNOSIS — M25632 Stiffness of left wrist, not elsewhere classified: Secondary | ICD-10-CM | POA: Diagnosis not present

## 2023-03-31 DIAGNOSIS — M25632 Stiffness of left wrist, not elsewhere classified: Secondary | ICD-10-CM | POA: Diagnosis not present

## 2023-04-13 DIAGNOSIS — M25641 Stiffness of right hand, not elsewhere classified: Secondary | ICD-10-CM | POA: Diagnosis not present

## 2023-04-13 DIAGNOSIS — S62632D Displaced fracture of distal phalanx of right middle finger, subsequent encounter for fracture with routine healing: Secondary | ICD-10-CM | POA: Diagnosis not present

## 2023-04-13 DIAGNOSIS — S52502D Unspecified fracture of the lower end of left radius, subsequent encounter for closed fracture with routine healing: Secondary | ICD-10-CM | POA: Diagnosis not present

## 2023-04-13 DIAGNOSIS — M25632 Stiffness of left wrist, not elsewhere classified: Secondary | ICD-10-CM | POA: Diagnosis not present

## 2023-04-16 DIAGNOSIS — M25641 Stiffness of right hand, not elsewhere classified: Secondary | ICD-10-CM | POA: Diagnosis not present

## 2023-04-16 DIAGNOSIS — M25632 Stiffness of left wrist, not elsewhere classified: Secondary | ICD-10-CM | POA: Diagnosis not present

## 2023-04-20 DIAGNOSIS — M25632 Stiffness of left wrist, not elsewhere classified: Secondary | ICD-10-CM | POA: Diagnosis not present

## 2023-04-20 DIAGNOSIS — M25641 Stiffness of right hand, not elsewhere classified: Secondary | ICD-10-CM | POA: Diagnosis not present

## 2023-04-23 DIAGNOSIS — M25641 Stiffness of right hand, not elsewhere classified: Secondary | ICD-10-CM | POA: Diagnosis not present

## 2023-04-23 DIAGNOSIS — M25632 Stiffness of left wrist, not elsewhere classified: Secondary | ICD-10-CM | POA: Diagnosis not present

## 2023-04-27 DIAGNOSIS — M25632 Stiffness of left wrist, not elsewhere classified: Secondary | ICD-10-CM | POA: Diagnosis not present

## 2023-04-30 DIAGNOSIS — M25641 Stiffness of right hand, not elsewhere classified: Secondary | ICD-10-CM | POA: Diagnosis not present

## 2023-04-30 DIAGNOSIS — M25632 Stiffness of left wrist, not elsewhere classified: Secondary | ICD-10-CM | POA: Diagnosis not present

## 2023-05-04 DIAGNOSIS — M25641 Stiffness of right hand, not elsewhere classified: Secondary | ICD-10-CM | POA: Diagnosis not present

## 2023-05-04 DIAGNOSIS — M25632 Stiffness of left wrist, not elsewhere classified: Secondary | ICD-10-CM | POA: Diagnosis not present

## 2023-05-07 DIAGNOSIS — M25632 Stiffness of left wrist, not elsewhere classified: Secondary | ICD-10-CM | POA: Diagnosis not present

## 2023-05-07 DIAGNOSIS — M25641 Stiffness of right hand, not elsewhere classified: Secondary | ICD-10-CM | POA: Diagnosis not present

## 2023-05-11 DIAGNOSIS — M25641 Stiffness of right hand, not elsewhere classified: Secondary | ICD-10-CM | POA: Diagnosis not present

## 2023-05-11 DIAGNOSIS — M25632 Stiffness of left wrist, not elsewhere classified: Secondary | ICD-10-CM | POA: Diagnosis not present

## 2023-05-14 DIAGNOSIS — M25641 Stiffness of right hand, not elsewhere classified: Secondary | ICD-10-CM | POA: Diagnosis not present

## 2023-05-14 DIAGNOSIS — M25632 Stiffness of left wrist, not elsewhere classified: Secondary | ICD-10-CM | POA: Diagnosis not present

## 2023-05-18 DIAGNOSIS — M25632 Stiffness of left wrist, not elsewhere classified: Secondary | ICD-10-CM | POA: Diagnosis not present

## 2023-05-18 DIAGNOSIS — M25641 Stiffness of right hand, not elsewhere classified: Secondary | ICD-10-CM | POA: Diagnosis not present

## 2023-05-20 DIAGNOSIS — S62632D Displaced fracture of distal phalanx of right middle finger, subsequent encounter for fracture with routine healing: Secondary | ICD-10-CM | POA: Diagnosis not present

## 2023-05-20 DIAGNOSIS — S52502D Unspecified fracture of the lower end of left radius, subsequent encounter for closed fracture with routine healing: Secondary | ICD-10-CM | POA: Diagnosis not present

## 2023-05-21 DIAGNOSIS — M25632 Stiffness of left wrist, not elsewhere classified: Secondary | ICD-10-CM | POA: Diagnosis not present

## 2023-05-25 DIAGNOSIS — M25632 Stiffness of left wrist, not elsewhere classified: Secondary | ICD-10-CM | POA: Diagnosis not present

## 2023-06-08 DIAGNOSIS — M25632 Stiffness of left wrist, not elsewhere classified: Secondary | ICD-10-CM | POA: Diagnosis not present

## 2023-06-17 DIAGNOSIS — H5203 Hypermetropia, bilateral: Secondary | ICD-10-CM | POA: Diagnosis not present

## 2023-06-17 DIAGNOSIS — H2513 Age-related nuclear cataract, bilateral: Secondary | ICD-10-CM | POA: Diagnosis not present

## 2023-06-17 DIAGNOSIS — H25011 Cortical age-related cataract, right eye: Secondary | ICD-10-CM | POA: Diagnosis not present

## 2023-06-17 DIAGNOSIS — H43813 Vitreous degeneration, bilateral: Secondary | ICD-10-CM | POA: Diagnosis not present

## 2023-06-17 DIAGNOSIS — H524 Presbyopia: Secondary | ICD-10-CM | POA: Diagnosis not present

## 2023-06-22 DIAGNOSIS — M25632 Stiffness of left wrist, not elsewhere classified: Secondary | ICD-10-CM | POA: Diagnosis not present

## 2023-07-14 DIAGNOSIS — M25632 Stiffness of left wrist, not elsewhere classified: Secondary | ICD-10-CM | POA: Diagnosis not present

## 2023-07-29 DIAGNOSIS — M65341 Trigger finger, right ring finger: Secondary | ICD-10-CM | POA: Diagnosis not present

## 2023-07-29 DIAGNOSIS — S52502A Unspecified fracture of the lower end of left radius, initial encounter for closed fracture: Secondary | ICD-10-CM | POA: Diagnosis not present

## 2023-07-29 DIAGNOSIS — S62632B Displaced fracture of distal phalanx of right middle finger, initial encounter for open fracture: Secondary | ICD-10-CM | POA: Diagnosis not present

## 2023-07-29 DIAGNOSIS — M79644 Pain in right finger(s): Secondary | ICD-10-CM | POA: Diagnosis not present

## 2023-07-29 DIAGNOSIS — M25532 Pain in left wrist: Secondary | ICD-10-CM | POA: Diagnosis not present

## 2023-08-25 DIAGNOSIS — M25562 Pain in left knee: Secondary | ICD-10-CM | POA: Diagnosis not present

## 2023-08-25 DIAGNOSIS — M25561 Pain in right knee: Secondary | ICD-10-CM | POA: Diagnosis not present

## 2023-09-01 DIAGNOSIS — M20031 Swan-neck deformity of right finger(s): Secondary | ICD-10-CM | POA: Diagnosis not present

## 2023-09-01 DIAGNOSIS — M67843 Other specified disorders of tendon, right hand: Secondary | ICD-10-CM | POA: Diagnosis not present

## 2023-09-01 DIAGNOSIS — M65341 Trigger finger, right ring finger: Secondary | ICD-10-CM | POA: Diagnosis not present

## 2023-09-01 DIAGNOSIS — M19041 Primary osteoarthritis, right hand: Secondary | ICD-10-CM | POA: Diagnosis not present

## 2023-09-01 DIAGNOSIS — M25841 Other specified joint disorders, right hand: Secondary | ICD-10-CM | POA: Diagnosis not present

## 2023-09-01 DIAGNOSIS — M65331 Trigger finger, right middle finger: Secondary | ICD-10-CM | POA: Diagnosis not present

## 2023-09-14 DIAGNOSIS — M79644 Pain in right finger(s): Secondary | ICD-10-CM | POA: Diagnosis not present

## 2023-09-14 DIAGNOSIS — M13841 Other specified arthritis, right hand: Secondary | ICD-10-CM | POA: Diagnosis not present

## 2023-09-14 DIAGNOSIS — M65341 Trigger finger, right ring finger: Secondary | ICD-10-CM | POA: Diagnosis not present

## 2023-09-21 DIAGNOSIS — M25641 Stiffness of right hand, not elsewhere classified: Secondary | ICD-10-CM | POA: Diagnosis not present

## 2023-10-07 DIAGNOSIS — M25641 Stiffness of right hand, not elsewhere classified: Secondary | ICD-10-CM | POA: Diagnosis not present

## 2023-10-15 DIAGNOSIS — M65331 Trigger finger, right middle finger: Secondary | ICD-10-CM | POA: Diagnosis not present

## 2023-11-11 DIAGNOSIS — M65331 Trigger finger, right middle finger: Secondary | ICD-10-CM | POA: Diagnosis not present

## 2023-11-11 DIAGNOSIS — M13841 Other specified arthritis, right hand: Secondary | ICD-10-CM | POA: Diagnosis not present

## 2023-11-23 ENCOUNTER — Other Ambulatory Visit: Payer: Self-pay | Admitting: Family Medicine

## 2023-11-23 DIAGNOSIS — Z1231 Encounter for screening mammogram for malignant neoplasm of breast: Secondary | ICD-10-CM

## 2023-12-02 DIAGNOSIS — M25561 Pain in right knee: Secondary | ICD-10-CM | POA: Diagnosis not present

## 2023-12-02 DIAGNOSIS — M25562 Pain in left knee: Secondary | ICD-10-CM | POA: Diagnosis not present

## 2023-12-02 DIAGNOSIS — M1711 Unilateral primary osteoarthritis, right knee: Secondary | ICD-10-CM | POA: Diagnosis not present

## 2023-12-24 DIAGNOSIS — M1711 Unilateral primary osteoarthritis, right knee: Secondary | ICD-10-CM | POA: Diagnosis not present

## 2023-12-24 DIAGNOSIS — M25561 Pain in right knee: Secondary | ICD-10-CM | POA: Diagnosis not present

## 2023-12-29 DIAGNOSIS — M25561 Pain in right knee: Secondary | ICD-10-CM | POA: Diagnosis not present

## 2023-12-29 DIAGNOSIS — M1711 Unilateral primary osteoarthritis, right knee: Secondary | ICD-10-CM | POA: Diagnosis not present

## 2024-01-05 DIAGNOSIS — M25561 Pain in right knee: Secondary | ICD-10-CM | POA: Diagnosis not present

## 2024-01-05 DIAGNOSIS — M1711 Unilateral primary osteoarthritis, right knee: Secondary | ICD-10-CM | POA: Diagnosis not present

## 2024-01-06 ENCOUNTER — Ambulatory Visit
Admission: RE | Admit: 2024-01-06 | Discharge: 2024-01-06 | Disposition: A | Source: Ambulatory Visit | Attending: Family Medicine | Admitting: Family Medicine

## 2024-01-06 DIAGNOSIS — Z1231 Encounter for screening mammogram for malignant neoplasm of breast: Secondary | ICD-10-CM

## 2024-02-28 ENCOUNTER — Other Ambulatory Visit (HOSPITAL_BASED_OUTPATIENT_CLINIC_OR_DEPARTMENT_OTHER): Payer: Self-pay | Admitting: Family Medicine

## 2024-02-28 DIAGNOSIS — Z13820 Encounter for screening for osteoporosis: Secondary | ICD-10-CM

## 2024-02-28 DIAGNOSIS — E2839 Other primary ovarian failure: Secondary | ICD-10-CM

## 2024-10-25 ENCOUNTER — Other Ambulatory Visit (HOSPITAL_BASED_OUTPATIENT_CLINIC_OR_DEPARTMENT_OTHER)
